# Patient Record
Sex: Female | Born: 1970 | Race: Black or African American | Hispanic: No | Marital: Married | Smoking: Never smoker
Health system: Southern US, Community
[De-identification: ages and names within clinical notes are randomized; demographics above are authoritative.]

---

## 1998-01-17 ENCOUNTER — Inpatient Hospital Stay (HOSPITAL_COMMUNITY): Admission: AD | Admit: 1998-01-17 | Discharge: 1998-01-17 | Payer: Self-pay | Admitting: *Deleted

## 1999-01-09 ENCOUNTER — Inpatient Hospital Stay (HOSPITAL_COMMUNITY): Admission: AD | Admit: 1999-01-09 | Discharge: 1999-01-09 | Payer: Self-pay | Admitting: *Deleted

## 1999-12-30 ENCOUNTER — Encounter: Payer: Self-pay | Admitting: *Deleted

## 1999-12-30 ENCOUNTER — Inpatient Hospital Stay (HOSPITAL_COMMUNITY): Admission: AD | Admit: 1999-12-30 | Discharge: 1999-12-30 | Payer: Self-pay | Admitting: *Deleted

## 2000-01-01 ENCOUNTER — Encounter: Admission: RE | Admit: 2000-01-01 | Discharge: 2000-01-01 | Payer: Self-pay | Admitting: Obstetrics & Gynecology

## 2000-09-01 ENCOUNTER — Other Ambulatory Visit: Admission: RE | Admit: 2000-09-01 | Discharge: 2000-09-01 | Payer: Self-pay | Admitting: *Deleted

## 2001-09-03 ENCOUNTER — Other Ambulatory Visit: Admission: RE | Admit: 2001-09-03 | Discharge: 2001-09-03 | Payer: Self-pay | Admitting: *Deleted

## 2012-01-04 ENCOUNTER — Emergency Department (HOSPITAL_BASED_OUTPATIENT_CLINIC_OR_DEPARTMENT_OTHER)
Admission: EM | Admit: 2012-01-04 | Discharge: 2012-01-04 | Disposition: A | Discharge status: Home / Self Care | Payer: 59 | Attending: Emergency Medicine | Admitting: Emergency Medicine

## 2012-01-04 ENCOUNTER — Encounter (HOSPITAL_BASED_OUTPATIENT_CLINIC_OR_DEPARTMENT_OTHER): Payer: Self-pay | Admitting: *Deleted

## 2012-01-04 DIAGNOSIS — R109 Unspecified abdominal pain: Secondary | ICD-10-CM | POA: Insufficient documentation

## 2012-01-04 DIAGNOSIS — M549 Dorsalgia, unspecified: Secondary | ICD-10-CM | POA: Insufficient documentation

## 2012-01-04 DIAGNOSIS — N72 Inflammatory disease of cervix uteri: Secondary | ICD-10-CM | POA: Insufficient documentation

## 2012-01-04 DIAGNOSIS — R110 Nausea: Secondary | ICD-10-CM | POA: Insufficient documentation

## 2012-01-04 LAB — URINALYSIS, ROUTINE W REFLEX MICROSCOPIC
Bilirubin Urine: NEGATIVE
Glucose, UA: NEGATIVE mg/dL
Hgb urine dipstick: NEGATIVE
Ketones, ur: 15 mg/dL — AB
Leukocytes, UA: NEGATIVE
Nitrite: NEGATIVE
Protein, ur: NEGATIVE mg/dL
Specific Gravity, Urine: 1.023 (ref 1.005–1.030)
Urobilinogen, UA: 0.2 mg/dL (ref 0.0–1.0)
pH: 6 (ref 5.0–8.0)

## 2012-01-04 LAB — WET PREP, GENITAL
Clue Cells Wet Prep HPF POC: NONE SEEN
Trich, Wet Prep: NONE SEEN
Yeast Wet Prep HPF POC: NONE SEEN

## 2012-01-04 LAB — PREGNANCY, URINE: Preg Test, Ur: NEGATIVE

## 2012-01-04 MED ORDER — CEFTRIAXONE SODIUM 250 MG IJ SOLR
250.0000 mg | Freq: Once | INTRAMUSCULAR | Status: AC
  Administered 2012-01-04: 250 mg via INTRAMUSCULAR
  Filled 2012-01-04: qty 250

## 2012-01-04 MED ORDER — LIDOCAINE HCL (PF) 1 % IJ SOLN
INTRAMUSCULAR | Status: AC
  Administered 2012-01-04: 5 mL
  Filled 2012-01-04: qty 5

## 2012-01-04 MED ORDER — AZITHROMYCIN 1 G PO PACK: 1.0000 g | PACK | Freq: Once | ORAL | Status: DC

## 2012-01-04 MED ORDER — ONDANSETRON 4 MG PO TBDP
4.0000 mg | ORAL_TABLET | Freq: Once | ORAL | Status: AC
Start: 2012-01-04 — End: 2012-01-04
  Administered 2012-01-04: 4 mg via ORAL
  Filled 2012-01-04: qty 1

## 2012-01-04 MED ORDER — AZITHROMYCIN 250 MG PO TABS
ORAL_TABLET | ORAL | Status: AC
  Administered 2012-01-04: 1000 mg
  Filled 2012-01-04: qty 4

## 2012-01-04 NOTE — ED Notes (Signed)
D/c home with no rx given

## 2012-01-04 NOTE — Discharge Instructions (Signed)
Cervicitis   Cervicitis is a soreness and swelling (inflammation) of the cervix. Your cervix is located at the bottom of your uterus which opens up to the vagina.   CAUSES   Sexually transmitted infections (STIs).   Allergic reaction.   Medicines or birth control devices that are put in the vagina.   Injury to the cervix.   Bacterial infections.   SYMPTOMS   There may be no symptoms. If symptoms occur, they may include:   Grey, white, yellow, or bad smelling vaginal discharge.   Pain or itching of the area outside the vagina.   Painful sexual intercourse.   Lower abdominal or lower back pain, especially during intercourse.   Frequent urination.   Abnormal vaginal bleeding between periods, after sexual intercourse, or after menopause.   Pressure or a heavy feeling in the pelvis.   DIAGNOSIS   Diagnosis is made after a pelvic exam. Other tests may include:   Examination of any discharge under a microscope (wet prep).   A Pap test.   TREATMENT   Treatment will depend on the cause of cervicitis. If it is caused by an STI, both you and your partner will need to be treated. Antibiotic medicines will be given.   HOME CARE INSTRUCTIONS   Do not have sexual intercourse until your caregiver says it is okay.   Do not have sexual intercourse until your partner has been treated if your cervicitis is caused by an STI.   Take your antibiotics as directed. Finish them even if you start to feel better.   SEEK IMMEDIATE MEDICAL CARE IF:   Your symptoms come back.   You have a fever.   You experience any problems that may be related to the medicine you are taking.   MAKE SURE YOU:   Understand these instructions.   Will watch your condition.   Will get help right away if you are not doing well or get worse.   Document Released: 09/09/2005 Document Revised: 08/29/2011 Document Reviewed: 04/08/2011   [employer] Patient Information 2012 [employer].

## 2012-01-04 NOTE — ED Notes (Signed)
Pt c/o low abd and back pain since Friday. Tan vaginal d/c and itching. +nausea

## 2012-01-04 NOTE — ED Provider Notes (Signed)
History     CSN: [id number]  Arrival date & time 01/04/12  2001   First MD Initiated Contact with Patient 01/04/12 2046      Chief Complaint  Patient presents with  . Abdominal Pain    (Consider location/radiation/quality/duration/timing/severity/associated sxs/prior treatment) Patient is a 41 y.o. female presenting with abdominal pain. The history is provided by the patient. No language interpreter was used.  Abdominal Pain The primary symptoms of the illness include abdominal pain, nausea and vaginal discharge. The primary symptoms of the illness do not include vomiting, diarrhea or dysuria. The current episode started yesterday. The onset of the illness was gradual. The problem has not changed since onset. The vaginal discharge is not associated with dysuria.   The patient states that she believes she is currently not pregnant. Additional symptoms associated with the illness include back pain. Symptoms associated with the illness do not include urgency or frequency.    History reviewed. No pertinent past medical history.  History reviewed. No pertinent past surgical history.  History reviewed. No pertinent family history.  History  Substance Use Topics  . Smoking status: Never Smoker   . Smokeless tobacco: Not on file  . Alcohol Use: No    OB History    Grav Para Term Preterm Abortions TAB SAB Ect Mult Living                  Review of Systems  Gastrointestinal: Positive for nausea and abdominal pain. Negative for vomiting and diarrhea.  Genitourinary: Positive for vaginal discharge. Negative for dysuria, urgency and frequency.  Musculoskeletal: Positive for back pain.  All other systems reviewed and are negative.    Allergies  Review of patient's allergies indicates not on file.  Home Medications  No current outpatient prescriptions on file.  BP 107/72  Pulse 70  Temp(Src) 97.9 F (36.6 C) (Oral)  Resp 20  Ht 5' 4.75" (1.645 m)  Wt 145 lb (65.772 kg)   BMI 24.32 kg/m2  SpO2 100%  LMP 12/22/2011  Physical Exam  Nursing note and vitals reviewed. Constitutional: She is oriented to person, place, and time. She appears well-developed and well-nourished.  HENT:  Head: Normocephalic and atraumatic.  Cardiovascular: Normal rate and regular rhythm.   Pulmonary/Chest: Effort normal and breath sounds normal.  Abdominal: Soft. Bowel sounds are normal.  Genitourinary: Vaginal discharge found.       -NWG:NFAO yellow vaginal discharge  Musculoskeletal: Normal range of motion.  Neurological: She is alert and oriented to person, place, and time.  Skin: Skin is warm and dry.    ED Course  Procedures (including critical care time)  Labs Reviewed  URINALYSIS, ROUTINE W REFLEX MICROSCOPIC - Abnormal; Notable for the following:    Ketones, ur 15 (*)    All other components within normal limits  WET PREP, GENITAL - Abnormal; Notable for the following:    WBC, Wet Prep HPF POC MANY (*) MODERATE BACTERIA SEEN   All other components within normal limits  PREGNANCY, URINE  GC/CHLAMYDIA PROBE AMP, GENITAL   No results found.   1. Cervicitis       MDM  Pt treat for cervicitis:pt explained follow up as needed        Teressa Lower, NP 01/04/12 2327

## 2012-01-05 NOTE — ED Provider Notes (Signed)
Medical screening examination/treatment/procedure(s) were performed by non-physician practitioner and as supervising physician I was immediately available for consultation/collaboration.    Nelia Shi, MD 01/05/12 [phone]

## 2012-01-06 LAB — GC/CHLAMYDIA PROBE AMP, GENITAL
Chlamydia, DNA Probe: NEGATIVE
GC Probe Amp, Genital: NEGATIVE

## 2016-10-25 DIAGNOSIS — M7581 Other shoulder lesions, right shoulder: Secondary | ICD-10-CM | POA: Insufficient documentation

## 2017-04-21 DIAGNOSIS — M75121 Complete rotator cuff tear or rupture of right shoulder, not specified as traumatic: Secondary | ICD-10-CM | POA: Insufficient documentation

## 2017-07-23 DIAGNOSIS — Z9889 Other specified postprocedural states: Secondary | ICD-10-CM | POA: Insufficient documentation

## 2018-06-10 DIAGNOSIS — E538 Deficiency of other specified B group vitamins: Secondary | ICD-10-CM | POA: Insufficient documentation

## 2018-06-10 DIAGNOSIS — E559 Vitamin D deficiency, unspecified: Secondary | ICD-10-CM | POA: Insufficient documentation

## 2019-04-14 DIAGNOSIS — K219 Gastro-esophageal reflux disease without esophagitis: Secondary | ICD-10-CM | POA: Insufficient documentation

## 2019-09-24 HISTORY — PX: CHOLECYSTECTOMY: SHX55

## 2019-11-22 ENCOUNTER — Encounter (HOSPITAL_COMMUNITY): Payer: Self-pay

## 2019-11-22 ENCOUNTER — Other Ambulatory Visit: Payer: Self-pay

## 2019-11-22 ENCOUNTER — Emergency Department (HOSPITAL_COMMUNITY): Payer: 59

## 2019-11-22 ENCOUNTER — Emergency Department (HOSPITAL_COMMUNITY)
Admission: EM | Admit: 2019-11-22 | Discharge: 2019-11-22 | Disposition: A | Discharge status: Home / Self Care | Payer: 59 | Attending: Emergency Medicine | Admitting: Emergency Medicine

## 2019-11-22 DIAGNOSIS — K8020 Calculus of gallbladder without cholecystitis without obstruction: Secondary | ICD-10-CM | POA: Diagnosis not present

## 2019-11-22 DIAGNOSIS — R1013 Epigastric pain: Secondary | ICD-10-CM | POA: Diagnosis present

## 2019-11-22 DIAGNOSIS — K529 Noninfective gastroenteritis and colitis, unspecified: Secondary | ICD-10-CM

## 2019-11-22 DIAGNOSIS — K5289 Other specified noninfective gastroenteritis and colitis: Secondary | ICD-10-CM | POA: Diagnosis not present

## 2019-11-22 LAB — CBC
HCT: 38.1 % (ref 36.0–46.0)
Hemoglobin: 12.9 g/dL (ref 12.0–15.0)
MCH: 30.4 pg (ref 26.0–34.0)
MCHC: 33.9 g/dL (ref 30.0–36.0)
MCV: 89.9 fL (ref 80.0–100.0)
Platelets: 318 10*3/uL (ref 150–400)
RBC: 4.24 MIL/uL (ref 3.87–5.11)
RDW: 12.7 % (ref 11.5–15.5)
WBC: 13.3 10*3/uL — ABNORMAL HIGH (ref 4.0–10.5)
nRBC: 0 % (ref 0.0–0.2)

## 2019-11-22 LAB — COMPREHENSIVE METABOLIC PANEL
ALT: 15 U/L (ref 0–44)
AST: 19 U/L (ref 15–41)
Albumin: 4 g/dL (ref 3.5–5.0)
Alkaline Phosphatase: 30 U/L — ABNORMAL LOW (ref 38–126)
Anion gap: 8 (ref 5–15)
BUN: 13 mg/dL (ref 6–20)
CO2: 24 mmol/L (ref 22–32)
Calcium: 9 mg/dL (ref 8.9–10.3)
Chloride: 104 mmol/L (ref 98–111)
Creatinine, Ser: 0.99 mg/dL (ref 0.44–1.00)
GFR calc Af Amer: >60 mL/min (ref >60)
GFR calc non Af Amer: >60 mL/min (ref >60)
Glucose, Bld: 112 mg/dL — ABNORMAL HIGH (ref 70–99)
Potassium: 4.4 mmol/L (ref 3.5–5.1)
Sodium: 136 mmol/L (ref 135–145)
Total Bilirubin: 0.2 mg/dL — ABNORMAL LOW (ref 0.3–1.2)
Total Protein: 7.4 g/dL (ref 6.5–8.1)

## 2019-11-22 LAB — URINALYSIS, ROUTINE W REFLEX MICROSCOPIC
Bilirubin Urine: NEGATIVE
Glucose, UA: NEGATIVE mg/dL
Hgb urine dipstick: NEGATIVE
Ketones, ur: 20 mg/dL — AB
Leukocytes,Ua: NEGATIVE
Nitrite: NEGATIVE
Protein, ur: 100 mg/dL — AB
Specific Gravity, Urine: 1.03 (ref 1.005–1.030)
pH: 6 (ref 5.0–8.0)

## 2019-11-22 LAB — LIPASE, BLOOD: Lipase: 16 U/L (ref 11–51)

## 2019-11-22 LAB — I-STAT BETA HCG BLOOD, ED (MC, WL, AP ONLY): I-stat hCG, quantitative: <5 m[IU]/mL (ref <5)

## 2019-11-22 MED ORDER — IOHEXOL 300 MG/ML  SOLN
100.0000 mL | Freq: Once | INTRAMUSCULAR | Status: AC | PRN
  Administered 2019-11-22: 100 mL via INTRAVENOUS

## 2019-11-22 MED ORDER — SODIUM CHLORIDE (PF) 0.9 % IJ SOLN
INTRAMUSCULAR | Status: AC
  Filled 2019-11-22: qty 50

## 2019-11-22 MED ORDER — ONDANSETRON HCL 4 MG/2ML IJ SOLN
4.0000 mg | Freq: Once | INTRAMUSCULAR | Status: AC
  Administered 2019-11-22: 4 mg via INTRAVENOUS
  Filled 2019-11-22: qty 2

## 2019-11-22 MED ORDER — SODIUM CHLORIDE 0.9% FLUSH
3.0000 mL | Freq: Once | INTRAVENOUS | Status: AC
  Administered 2019-11-22: 3 mL via INTRAVENOUS

## 2019-11-22 MED ORDER — FENTANYL CITRATE (PF) 100 MCG/2ML IJ SOLN
100.0000 ug | INTRAMUSCULAR | Status: DC | PRN
  Administered 2019-11-22 (×2): 100 ug via INTRAVENOUS
  Filled 2019-11-22 (×2): qty 2

## 2019-11-22 MED ORDER — METRONIDAZOLE 500 MG PO TABS: 500.0000 mg | ORAL_TABLET | Freq: Two times a day (BID) | ORAL | 0 refills | Status: DC

## 2019-11-22 MED ORDER — HYDROCODONE-ACETAMINOPHEN 5-325 MG PO TABS: 1.0000 | ORAL_TABLET | ORAL | 0 refills | Status: DC | PRN

## 2019-11-22 MED ORDER — SODIUM CHLORIDE 0.9 % IV BOLUS
1000.0000 mL | Freq: Once | INTRAVENOUS | Status: AC
  Administered 2019-11-22: 1000 mL via INTRAVENOUS

## 2019-11-22 MED ORDER — CIPROFLOXACIN HCL 500 MG PO TABS: 500.0000 mg | ORAL_TABLET | Freq: Two times a day (BID) | ORAL | 0 refills | Status: DC

## 2019-11-22 NOTE — ED Notes (Signed)
Pt provided cup for UA, aware sample needee

## 2019-11-22 NOTE — ED Provider Notes (Signed)
[facility]-EMERGENCY DEPT Provider Note   CSN: [id number] Arrival date & time: 11/22/19  1349     History Chief Complaint  Patient presents with  . Abdominal Pain    Aimee Sanchez is a 49 y.o. female.  HPI She presents for evaluation of vomiting and diarrhea which started today, multiple times each.  She denies blood in emesis or stool.  She denies fever, chills, cough, shortness of breath, weakness or dizziness.  No known sick exposures.  She works as a Building surveyor.  There are no other known modifying factors.    History reviewed. No pertinent past medical history.  There are no problems to display for this patient.   No past surgical history on file.   OB History   No obstetric history on file.     No family history on file.  Social History   Tobacco Use  . Smoking status: Never Smoker  Substance Use Topics  . Alcohol use: No  . Drug use: No    Home Medications Prior to Admission medications   Medication Sig Start Date End Date Taking? Authorizing Provider  [facility] VITAMIN B12 1000 MCG tablet Take 1,000 mcg by mouth daily. 08/26/19  Yes [provider]  famotidine (PEPCID) 20 MG tablet Take 20 mg by mouth daily.  10/12/19  Yes [provider]  Multiple Vitamin (MULTIVITAMIN) tablet Take 1 tablet by mouth daily.   Yes [provider]  Vitamin D, Ergocalciferol, (DRISDOL) 1.25 MG (50000 UNIT) CAPS capsule Take 50,000 Units by mouth once a week. 07/14/19  Yes [provider]  ciprofloxacin (CIPRO) 500 MG tablet Take 1 tablet (500 mg total) by mouth 2 (two) times daily. One po bid x 7 days 11/22/19   Mancel Bale, MD  HYDROcodone-acetaminophen [facility]) 5-325 MG tablet Take 1 tablet by mouth every 4 (four) hours as needed for moderate pain. 11/22/19   Mancel Bale, MD  metroNIDAZOLE (FLAGYL) 500 MG tablet Take 1 tablet (500 mg total) by mouth 2 (two) times daily. One po bid x 7 days 11/22/19   Mancel Bale, MD     Allergies    Patient has no known allergies.  Review of Systems   Review of Systems  All other systems reviewed and are negative.   Physical Exam Updated Vital Signs BP 123/79   Pulse 65   Temp 98.2 F (36.8 C) (Oral)   Resp 17   Ht 5\' 4"  (1.626 m)   Wt 78 kg   LMP 10/28/2019 (Approximate)   SpO2 100%   BMI 29.52 kg/m   Physical Exam Vitals and nursing note reviewed.  Constitutional:      General: She is not in acute distress.    Appearance: She is well-developed. She is not ill-appearing, toxic-appearing or diaphoretic.  HENT:     Head: Normocephalic and atraumatic.     Right Ear: External ear normal.     Left Ear: External ear normal.  Eyes:     Conjunctiva/sclera: Conjunctivae normal.     Pupils: Pupils are equal, round, and reactive to light.  Neck:     Trachea: Phonation normal.  Cardiovascular:     Rate and Rhythm: Normal rate and regular rhythm.     Heart sounds: Normal heart sounds.  Pulmonary:     Effort: Pulmonary effort is normal.     Breath sounds: Normal breath sounds.  Abdominal:     General: There is no distension.     Palpations: Abdomen is soft. There  is no mass.     Tenderness: There is abdominal tenderness (Epigastric, mild).     Hernia: No hernia is present.  Musculoskeletal:        General: Normal range of motion.     Cervical back: Normal range of motion and neck supple.  Skin:    General: Skin is warm and dry.  Neurological:     Mental Status: She is alert and oriented to person, place, and time.     Cranial Nerves: No cranial nerve deficit.     Sensory: No sensory deficit.     Motor: No abnormal muscle tone.     Coordination: Coordination normal.  Psychiatric:        Mood and Affect: Mood normal.        Behavior: Behavior normal.        Thought Content: Thought content normal.        Judgment: Judgment normal.     ED Results / Procedures / Treatments   Labs (all labs ordered are listed, but only abnormal results are  displayed) Labs Reviewed  COMPREHENSIVE METABOLIC PANEL - Abnormal; Notable for the following components:      Result Value   Glucose, Bld 112 (*)    Alkaline Phosphatase 30 (*)    Total Bilirubin 0.2 (*)    All other components within normal limits  CBC - Abnormal; Notable for the following components:   WBC 13.3 (*)    All other components within normal limits  URINALYSIS, ROUTINE W REFLEX MICROSCOPIC - Abnormal; Notable for the following components:   APPearance TURBID (*)    Ketones, ur 20 (*)    Protein, ur 100 (*)    Bacteria, UA FEW (*)    All other components within normal limits  LIPASE, BLOOD  I-STAT BETA HCG BLOOD, ED ([facility], [facility], [facility] ONLY)    EKG None  Radiology US Abdomen Complete  Result Date: 11/22/2019 CLINICAL DATA:  Upper abdominal pain for 2 days EXAM: ABDOMEN ULTRASOUND COMPLETE COMPARISON:  None. FINDINGS: Gallbladder: Gallbladder is well distended with multiple gallstones within. No wall thickening or pericholecystic fluid is noted. Common bile duct: Diameter: 6 mm Liver: No focal lesion identified. Within normal limits in parenchymal echogenicity. Mild central biliary ductal dilatation is noted. Portal vein is patent on color Doppler imaging with normal direction of blood flow towards the liver. IVC: No abnormality visualized. Pancreas: Visualized portion unremarkable. Spleen: Size and appearance within normal limits. Right Kidney: Length: 8.5 cm. Echogenicity within normal limits. No mass or hydronephrosis visualized. Left Kidney: Length: 9.4 cm. Echogenicity within normal limits. No mass or hydronephrosis visualized. Abdominal aorta: No aneurysm visualized. Other findings: None. IMPRESSION: Multiple gallstones. Mild intrahepatic ductal dilatation is noted centrally. Correlation with laboratory values is recommended. No other focal abnormality is noted. Electronically Signed   By: Alcide Clever M.D.   On: 11/22/2019 20:49   CT Abdomen Pelvis W Contrast  Result Date:  11/22/2019 CLINICAL DATA:  Abdomen pain with vomiting EXAM: CT ABDOMEN AND PELVIS WITH CONTRAST TECHNIQUE: Multidetector CT imaging of the abdomen and pelvis was performed using the standard protocol following bolus administration of intravenous contrast. CONTRAST:  OMNIPAQUE IOHEXOL 300 MG/ML  SOLN COMPARISON:  Chest x-ray 11/22/2027 FINDINGS: Lower chest: Lung bases demonstrate no acute consolidation or effusion. The heart size is within normal limits. Hepatobiliary: No focal hepatic abnormality. No biliary dilatation. Tortuous gallbladder with suspected pericholecystic fluid but otherwise no significant inflammatory change. Pancreas: Unremarkable. No pancreatic ductal dilatation or surrounding  inflammatory changes. Spleen: Normal in size without focal abnormality. Adrenals/Urinary Tract: Adrenal glands are unremarkable. Kidneys are normal, without renal calculi, focal lesion, or hydronephrosis. Bladder is unremarkable. Stomach/Bowel: Stomach is nonenlarged. No dilated small bowel. Negative appendix. Collapsed appearance versus mild colitis type changes of the transverse colon to the rectosigmoid colon. Scattered colon diverticula. Vascular/Lymphatic: No significant vascular findings are present. No enlarged abdominal or pelvic lymph nodes. Reproductive: Lobulated uterus with multiple hypoenhancing masses. No adnexal mass. Other: No free air or free fluid Musculoskeletal: No acute or significant osseous findings. IMPRESSION: 1. Tortuous appearing gallbladder. There appears to be pericholecystic fluid. If acute gallbladder disease is suspected, correlation with ultrasound would be advised. 2. Collapsed appearance versus mild colitis type changes from the transverse colon to the rectosigmoid colon 3. Lobulated uterus with multiple hypoenhancing masses, presumably fibroids. Electronically Signed   By: Donavan Foil M.D.   On: 11/22/2019 18:16   DG Chest Port 1 View  Result Date: 11/22/2019 CLINICAL DATA:   Chest and abdominal pain with vomiting and diarrhea. EXAM: PORTABLE CHEST 1 VIEW COMPARISON:  None. FINDINGS: The cardiac silhouette, mediastinal and hilar contours are normal. The lungs are clear. No pleural effusions. No pulmonary lesions. The bony thorax is intact. IMPRESSION: Normal chest x-ray. Electronically Signed   By: Marijo Sanes M.D.   On: 11/22/2019 16:58    Procedures Procedures (including critical care time)  Medications Ordered in ED Medications  fentaNYL (SUBLIMAZE) injection 100 mcg (100 mcg Intravenous Given 11/22/19 1936)  sodium chloride flush (NS) 0.9 % injection 3 mL (3 mLs Intravenous Given 11/22/19 1718)  ondansetron (ZOFRAN) injection 4 mg (4 mg Intravenous Given 11/22/19 1719)  sodium chloride 0.9 % bolus 1,000 mL (0 mLs Intravenous Stopped 11/22/19 1934)  iohexol (OMNIPAQUE) 300 MG/ML solution 100 mL (100 mLs Intravenous Contrast Given 11/22/19 1748)  ondansetron (ZOFRAN) injection 4 mg (4 mg Intravenous Given 11/22/19 1943)    ED Course  I have reviewed the triage vital signs and the nursing notes.  Pertinent labs & imaging results that were available during my care of the patient were reviewed by me and considered in my medical decision making (see chart for details).  Clinical Course as of Nov 22 2134  Mon Nov 22, 2019  2056 Normal except turbid appearance, presence of ketones and protein, few bacteria and 6-10 RBCs  Urinalysis, Routine w reflex microscopic(!) [EW]  2057 Normal  I-Stat beta hCG blood, ED [EW]  2057 Normal  Lipase, blood [EW]  2057 Normal except white count elevated  CBC(!) [EW]  2057 Normal except glucose high, alkaline phosphatase high, total bilirubin low  Comprehensive metabolic panel(!) [EW]  4854 No infiltrate or CHF, interpreted by me  DG Chest [facility] [EW]  2058 Per radiologist, possible cholecystitis, and possible colitis.   [EW]  2058 Per radiologist, gallstones without cholecystitis.   [EW]    Clinical Course User Index [EW]  Daleen Bo, MD   MDM Rules/Calculators/A&P                       Patient Vitals for the past 24 hrs:  BP Temp Temp src Pulse Resp SpO2 Height Weight  11/22/19 2100 123/79 -- -- 65 17 100 % -- --  11/22/19 2000 117/79 -- -- 77 19 98 % -- --  11/22/19 1912 140/89 -- -- 64 15 100 % -- --  11/22/19 1643 121/72 -- -- 65 16 100 % -- --  11/22/19 1413 -- -- -- -- --  99 % 5\' 4"  (1.626 m) 78 kg  11/22/19 1410 131/86 98.2 F (36.8 C) Oral 78 16 99 % -- --    9:17 PM Reevaluation with update and discussion. After initial assessment and treatment, an updated evaluation reveals she is fairly comfortable at this time.  Right upper quadrant, with mild tenderness but no rebound tenderness.  Remainder the abdomen is benign.  Findings discussed with patient all questions were answered. 01/22/20   Medical Decision Making: Abdominal pain, with gallstones and mild colitis.  Doubt serious bacterial infection, metabolic instability or impending vascular collapse.  Patient symptoms controlled in the ED and she is stable for discharge with outpatient management.  Kiylah Jones-Bennett was evaluated in Emergency Department on 11/22/2019 for the symptoms described in the history of present illness. She was evaluated in the context of the global COVID-19 pandemic, which necessitated consideration that the patient might be at risk for infection with the SARS-CoV-2 virus that causes COVID-19. Institutional protocols and algorithms that pertain to the evaluation of patients at risk for COVID-19 are in a state of rapid change based on information released by regulatory bodies including the CDC and federal and state organizations. These policies and algorithms were followed during the patient's care in the ED.   CRITICAL CARE-no Performed by: 01/22/2020   Nursing Notes Reviewed/ Care Coordinated Applicable Imaging Reviewed Interpretation of Laboratory Data incorporated into ED treatment  The patient appears  reasonably screened and/or stabilized for discharge and I doubt any other medical condition or other [facility] requiring further screening, evaluation, or treatment in the ED at this time prior to discharge.  Plan: Home Medications-continue usual; Home Treatments-low-fat diet; return here if the recommended treatment, does not improve the symptoms; Recommended follow up-general surgery follow-up for gallstones as soon as possible    Final Clinical Impression(s) / ED Diagnoses Final diagnoses:  Gallstones  Colitis    Rx / DC Orders ED Discharge Orders         Ordered    HYDROcodone-acetaminophen (NORCO) 5-325 MG tablet  Every 4 hours PRN     11/22/19 2131    ciprofloxacin (CIPRO) 500 MG tablet  2 times daily     11/22/19 2132    metroNIDAZOLE (FLAGYL) 500 MG tablet  2 times daily     11/22/19 2132           2133, MD 11/22/19 2136

## 2019-11-22 NOTE — ED Triage Notes (Signed)
49 yo female from home with midumbilical abd pain and vomiting since 1030pm last night. Diarrhea since this morning. Vomiting bile at this time. Denies fevers/chills. Hx of GERD.

## 2019-11-22 NOTE — Discharge Instructions (Addendum)
Stay on a low-fat diet, and drink plenty of fluids to prevent problems from the gallstones.  Stay on a low fiber diet, to prevent complications from colitis.  Call the surgeons for follow-up appointment about the gallstones and colitis.

## 2019-12-13 ENCOUNTER — Ambulatory Visit: Payer: Self-pay | Admitting: Surgery

## 2019-12-13 NOTE — H&P (Signed)
Aimee Sanchez Documented: 12/13/2019 3:00 PM Location: [facility] Patient #: [id number] DOB: [birth year] Single / Language: [place] / Race: Black or African American Female  History of Present Illness Aimee Sportsman MD; 12/13/2019 3:38 PM) The patient is a 49 year old female who presents for evaluation of gall stones. Note for "Gall stones": ` ` ` Patient sent for surgical consultation at the request of Dr Gray Bernhardt  Chief Complaint: Nausea vomiting and gallstones. Consider cholecystectomy ` ` The patient is a active woman otherwise healthy who had nausea or vomiting. Persisted. Went to the emergency room. No hematemesis. No hematochezia. Upper abdominal discomfort. CAT scan concerning for some pericholecystic fluid. Left-sided colon with some mild thickening concerning for possible collapse versus colitis. Patient seems more uncomfortable and upper abdomen. Ultrasound done. No definite cholecystitis noted. Patient seemed to improve and stabilize emergency room. Surgical follow-up recommended.  She gets episodes of upper abdominal pain. Some crampiness she feels like she has more chronic soreness in right upper quadrant now. She's had some episodes of loose bowel movements she's been constipated more than a couple days. She denies really any true diarrhea. No hematochezia. She's never had any abdominal surgery. She really drinks alcohol. Her husband thought that maybe was a kidney stone since he had similar symptoms. However CAT scan rule out out. No history of urinary tract infections or pyelonephritis. She can walk a couple miles without difficulty. Indigestion usually treated over-the-counter. This felt different from this and she cannot get improvement with those medications  No personal nor family history of GI/colon cancer, inflammatory bowel disease, irritable bowel syndrome, allergy such as Celiac Sprue, dietary/dairy problems, colitis, ulcers  nor gastritis. No recent sick contacts/gastroenteritis. No travel outside the country. No changes in diet. No dysphagia to solids or liquids. No significant heartburn or reflux. No hematochezia, hematemesis, coffee ground emesis. No evidence of prior gastric/peptic ulceration.  (Review of systems as stated in this history (HPI) or in the review of systems. Otherwise all other 12 point ROS are negative) ` ` `  This patient encounter took 35 minutes today to perform the following: obtain history, perform exam, review outside records, interpret tests & imaging, counsel the patient on their diagnosis; and, document this encounter, including findings & plan in the electronic health record (EHR).   Past Surgical History Aimee Sanchez, CMA; 12/13/2019 3:12 PM) Foot Surgery Bilateral.  Diagnostic Studies History (Aimee Sanchez, CMA; 12/13/2019 3:12 PM) Colonoscopy never Mammogram within last year Pap Smear 1-5 years ago  Allergies Aimee Sanchez, CMA; 12/13/2019 3:14 PM) No Known Allergies [12/13/2019]:  Medication History Aimee Sanchez, CMA; 12/13/2019 3:14 PM) Multivitamin Adult (Oral) Active. Medications Reconciled  Social History Aimee Sanchez, CMA; 12/13/2019 3:12 PM) Alcohol use Occasional alcohol use. Caffeine use Coffee. Tobacco use Never smoker.  Family History Aimee Sanchez, CMA; 12/13/2019 3:12 PM) Diabetes Mellitus Mother. Heart Disease Father, Mother. Hypertension Father, Mother.  Pregnancy / Birth History Aimee Sanchez, CMA; 12/13/2019 3:12 PM) Age at menarche 13 years. Age of menopause 33-50 Gravida 1 Length (months) of breastfeeding 3-6 Maternal age 61-35 Para 1 Regular periods  Other Problems Aimee Sanchez, CMA; 12/13/2019 3:12 PM) Gastroesophageal Reflux Disease     Review of Systems (Aimee Sanchez CMA; 12/13/2019 3:12 PM) General Present- Appetite Loss. Not Present- Chills, Fatigue, Fever, Night Sweats, Weight Gain and  Weight Loss. Skin Not Present- Change in Wart/Mole, Dryness, Hives, Jaundice, New Lesions, Non-Healing Wounds, Rash and Ulcer. HEENT Not Present- Earache, Hearing Loss, Hoarseness, Nose Bleed, Oral  Ulcers, Ringing in the Ears, Seasonal Allergies, Sinus Pain, Sore Throat, Visual Disturbances, Wears glasses/contact lenses and Yellow Eyes. Respiratory Not Present- Bloody sputum, Chronic Cough, Difficulty Breathing, Snoring and Wheezing. Breast Not Present- Breast Mass, Breast Pain, Nipple Discharge and Skin Changes. Cardiovascular Not Present- Chest Pain, Difficulty Breathing Lying Down, Leg Cramps, Palpitations, Rapid Heart Rate, Shortness of Breath and Swelling of Extremities. Gastrointestinal Present- Abdominal Pain, Bloating, Excessive gas, Indigestion and Nausea. Not Present- Bloody Stool, Change in Bowel Habits, Chronic diarrhea, Constipation, Difficulty Swallowing, Gets full quickly at meals, Hemorrhoids, Rectal Pain and Vomiting. Female Genitourinary Not Present- Frequency, Nocturia, Painful Urination, Pelvic Pain and Urgency. Neurological Not Present- Decreased Memory, Fainting, Headaches, Numbness, Seizures, Tingling, Tremor, Trouble walking and Weakness. Psychiatric Not Present- Anxiety, Bipolar, Change in Sleep Pattern, Depression, Fearful and Frequent crying. Endocrine Not Present- Cold Intolerance, Excessive Hunger, Hair Changes, Heat Intolerance, Hot flashes and New Diabetes. Hematology Not Present- Blood Thinners, Easy Bruising, Excessive bleeding, Gland problems, HIV and Persistent Infections.  Vitals (Aimee Sanchez CMA; 12/13/2019 3:14 PM) 12/13/2019 3:12 PM Weight: 171.25 lb Height: 65.5in Body Surface Area: 1.86 m Body Mass Index: 28.06 kg/m  Temp.: 97.66F  Pulse: 104 (Regular)  P.OX: 99% (Room air) BP: 102/78 (Sitting, Left Arm, Standard)        Physical Exam Aimee Sportsman MD; 12/13/2019 3:36 PM)  General Mental Status-Alert. General Appearance-Not  in acute distress, Not Sickly. Orientation-Oriented X3. Hydration-Well hydrated. Voice-Normal. Note: Sitting normally. Well-nourished. Not toxic. Not sickly appearing  Integumentary Global Assessment Upon inspection and palpation of skin surfaces of the - Axillae: non-tender, no inflammation or ulceration, no drainage. and Distribution of scalp and body hair is normal. General Characteristics Temperature - normal warmth is noted.  Head and Neck Head-normocephalic, atraumatic with no lesions or palpable masses. Face Global Assessment - atraumatic, no absence of expression. Neck Global Assessment - no abnormal movements, no bruit auscultated on the right, no bruit auscultated on the left, no decreased range of motion, non-tender. Trachea-midline. Thyroid Gland Characteristics - non-tender.  Eye Eyeball - Left-Extraocular movements intact, No Nystagmus - Left. Eyeball - Right-Extraocular movements intact, No Nystagmus - Right. Cornea - Left-No Hazy - Left. Cornea - Right-No Hazy - Right. Sclera/Conjunctiva - Left-No scleral icterus, No Discharge - Left. Sclera/Conjunctiva - Right-No scleral icterus, No Discharge - Right. Pupil - Left-Direct reaction to light normal. Pupil - Right-Direct reaction to light normal.  ENMT Ears Pinna - Left - no drainage observed, no generalized tenderness observed. Pinna - Right - no drainage observed, no generalized tenderness observed. Nose and Sinuses External Inspection of the Nose - no destructive lesion observed. Inspection of the nares - Left - quiet respiration. Inspection of the nares - Right - quiet respiration. Mouth and Throat Lips - Upper Lip - no fissures observed, no pallor noted. Lower Lip - no fissures observed, no pallor noted. Nasopharynx - no discharge present. Oral Cavity/Oropharynx - Tongue - no dryness observed. Oral Mucosa - no cyanosis observed. Hypopharynx - no evidence of airway distress  observed.  Chest and Lung Exam Inspection Movements - Normal and Symmetrical. Accessory muscles - No use of accessory muscles in breathing. Palpation Palpation of the chest reveals - Non-tender. Auscultation Breath sounds - Normal and Clear.  Cardiovascular Auscultation Rhythm - Regular. Murmurs & Other Heart Sounds - Auscultation of the heart reveals - No Murmurs and No Systolic Clicks.  Abdomen Inspection Inspection of the abdomen reveals - No Visible peristalsis and No Abnormal pulsations. Umbilicus - No Bleeding, No Urine  drainage. Palpation/Percussion Palpation and Percussion of the abdomen reveal - Soft, Non Tender, No Rebound tenderness, No Rigidity (guarding) and No Cutaneous hyperesthesia. Note: Abdomen soft. Mild discomfort wrapper quadrant but no true Murphy sign. Minimal epigastric discomfort. Left upper quadrant lower abdomen nontender. Mild diastases right. Not severely distended. No umbilical or other anterior abdominal wall hernias  Female Genitourinary Sexual Maturity Tanner 5 - Adult hair pattern. Note: No vaginal bleeding nor discharge  Peripheral Vascular Upper Extremity Inspection - Left - No Cyanotic nailbeds - Left, Not Ischemic. Inspection - Right - No Cyanotic nailbeds - Right, Not Ischemic.  Neurologic Neurologic evaluation reveals -normal attention span and ability to concentrate, able to name objects and repeat phrases. Appropriate fund of knowledge , normal sensation and normal coordination. Mental Status Affect - not angry, not paranoid. Cranial Nerves-Normal Bilaterally. Gait-Normal.  Neuropsychiatric Mental status exam performed with findings of-able to articulate well with normal speech/language, rate, volume and coherence, thought content normal with ability to perform basic computations and apply abstract reasoning and no evidence of hallucinations, delusions, obsessions or homicidal/suicidal ideation.  Musculoskeletal Global  Assessment Spine, Ribs and Pelvis - no instability, subluxation or laxity. Right Upper Extremity - no instability, subluxation or laxity.  Lymphatic Head & Neck  General Head & Neck Lymphatics: Bilateral - Description - No Localized lymphadenopathy. Axillary  General Axillary Region: Bilateral - Description - No Localized lymphadenopathy. Femoral & Inguinal  Generalized Femoral & Inguinal Lymphatics: Left - Description - No Localized lymphadenopathy. Right - Description - No Localized lymphadenopathy.    Assessment & Plan Aimee Sportsman MD; 12/13/2019 3:36 PM)  CHRONIC CHOLECYSTITIS WITH CALCULUS (K80.10) Impression: Story very suspicious for biliary colic. Recurrent symptoms. Pain more the right upper quadrant. That seems to correlate better with her episodes of epigastric pain and nausea and vomiting.  Did not seem consistent with heartburn or reflux. This question of colitis I think is an overcall as with his collapse without inflammation. She did and he did not have any diarrhea or hematochezia. Otherwise healthy from a cardiac standpoint. No alcohol or other hepatobiliary pancreatic risk factors.  I think she would benefit from cholecystectomy. She does seem to have a stretched out curled gallbladder. Not consistent with a choledochal cyst. Reasonable single site approach. Outpatient surgery. She is interested in proceeding. Her husband agrees  Current Plans You are being scheduled for surgery- Our schedulers will call you.  You should hear from our office's scheduling department within 5 working days about the location, date, and time of surgery. We try to make accommodations for patient's preferences in scheduling surgery, but sometimes the OR schedule or the surgeon's schedule prevents [place] from making those accommodations.  If you have not heard from our office [phone]) in 5 working days, call the office and ask for your surgeon's nurse.  If you have other questions  about your diagnosis, plan, or surgery, call the office and ask for your surgeon's nurse.  Written instructions provided Pt Education - Pamphlet Given - Laparoscopic Gallbladder Surgery: discussed with patient and provided information. The anatomy & physiology of hepatobiliary & pancreatic function was discussed. The pathophysiology of gallbladder dysfunction was discussed. Natural history risks without surgery was discussed. I feel the risks of no intervention will lead to serious problems that outweigh the operative risks; therefore, I recommended cholecystectomy to remove the pathology. I explained laparoscopic techniques with possible need for an open approach. Probable cholangiogram to evaluate the bilary tract was explained as well.  Risks such as bleeding,  infection, abscess, leak, injury to other organs, need for further treatment, heart attack, death, and other risks were discussed. I noted a good likelihood this will help address the problem. Possibility that this will not correct all abdominal symptoms was explained. Goals of post-operative recovery were discussed as well. We will work to minimize complications. An educational handout further explaining the pathology and treatment options was given as well. Questions were answered. The patient expresses understanding & wishes to proceed with surgery.  Pt Education - [facility] Laparosopic Post Op HCI (Keaundra Stehle) Pt Education - [facility] Good Bowel Health (Zackariah Vanderpol) Pt Education - Laparoscopic Cholecystectomy: gallbladder  Adin Hector, MD, FACS, MASCRS Gastrointestinal and Minimally Invasive Surgery  [facility] [address] [place], [place] [address] [phone] Main / Paging [phone] Fax

## 2020-04-01 ENCOUNTER — Inpatient Hospital Stay (HOSPITAL_COMMUNITY)
Admission: EM | Admit: 2020-04-01 | Discharge: 2020-04-02 | DRG: 200 | Disposition: A | Discharge status: Home / Self Care | Payer: 59 | Attending: Surgery | Admitting: Surgery

## 2020-04-01 ENCOUNTER — Inpatient Hospital Stay (HOSPITAL_COMMUNITY): Payer: 59

## 2020-04-01 ENCOUNTER — Encounter (HOSPITAL_COMMUNITY): Payer: Self-pay | Admitting: Radiology

## 2020-04-01 ENCOUNTER — Other Ambulatory Visit: Payer: Self-pay

## 2020-04-01 ENCOUNTER — Emergency Department (HOSPITAL_COMMUNITY): Payer: 59

## 2020-04-01 DIAGNOSIS — M25511 Pain in right shoulder: Secondary | ICD-10-CM | POA: Diagnosis present

## 2020-04-01 DIAGNOSIS — Y92410 Unspecified street and highway as the place of occurrence of the external cause: Secondary | ICD-10-CM | POA: Diagnosis not present

## 2020-04-01 DIAGNOSIS — Z20822 Contact with and (suspected) exposure to covid-19: Secondary | ICD-10-CM | POA: Diagnosis present

## 2020-04-01 DIAGNOSIS — S42001A Fracture of unspecified part of right clavicle, initial encounter for closed fracture: Secondary | ICD-10-CM

## 2020-04-01 DIAGNOSIS — M25512 Pain in left shoulder: Secondary | ICD-10-CM | POA: Diagnosis present

## 2020-04-01 DIAGNOSIS — R519 Headache, unspecified: Secondary | ICD-10-CM | POA: Diagnosis present

## 2020-04-01 DIAGNOSIS — Z79899 Other long term (current) drug therapy: Secondary | ICD-10-CM

## 2020-04-01 DIAGNOSIS — S2242XA Multiple fractures of ribs, left side, initial encounter for closed fracture: Secondary | ICD-10-CM

## 2020-04-01 DIAGNOSIS — S27329A Contusion of lung, unspecified, initial encounter: Secondary | ICD-10-CM

## 2020-04-01 DIAGNOSIS — S42034A Nondisplaced fracture of lateral end of right clavicle, initial encounter for closed fracture: Secondary | ICD-10-CM | POA: Diagnosis not present

## 2020-04-01 DIAGNOSIS — M79662 Pain in left lower leg: Secondary | ICD-10-CM | POA: Diagnosis present

## 2020-04-01 DIAGNOSIS — S270XXA Traumatic pneumothorax, initial encounter: Secondary | ICD-10-CM | POA: Diagnosis present

## 2020-04-01 DIAGNOSIS — S42031A Displaced fracture of lateral end of right clavicle, initial encounter for closed fracture: Secondary | ICD-10-CM | POA: Diagnosis present

## 2020-04-01 DIAGNOSIS — R911 Solitary pulmonary nodule: Secondary | ICD-10-CM | POA: Diagnosis present

## 2020-04-01 DIAGNOSIS — J939 Pneumothorax, unspecified: Secondary | ICD-10-CM

## 2020-04-01 LAB — BASIC METABOLIC PANEL
Anion gap: 8 (ref 5–15)
BUN: 16 mg/dL (ref 6–20)
CO2: 22 mmol/L (ref 22–32)
Calcium: 9 mg/dL (ref 8.9–10.3)
Chloride: 104 mmol/L (ref 98–111)
Creatinine, Ser: 1.09 mg/dL — ABNORMAL HIGH (ref 0.44–1.00)
GFR calc Af Amer: >60 mL/min (ref >60)
GFR calc non Af Amer: 60 mL/min — ABNORMAL LOW (ref >60)
Glucose, Bld: 120 mg/dL — ABNORMAL HIGH (ref 70–99)
Potassium: 4.8 mmol/L (ref 3.5–5.1)
Sodium: 134 mmol/L — ABNORMAL LOW (ref 135–145)

## 2020-04-01 LAB — CBC
HCT: 36.9 % (ref 36.0–46.0)
Hemoglobin: 12.1 g/dL (ref 12.0–15.0)
MCH: 29.7 pg (ref 26.0–34.0)
MCHC: 32.8 g/dL (ref 30.0–36.0)
MCV: 90.4 fL (ref 80.0–100.0)
Platelets: 323 10*3/uL (ref 150–400)
RBC: 4.08 MIL/uL (ref 3.87–5.11)
RDW: 12.7 % (ref 11.5–15.5)
WBC: 19.4 10*3/uL — ABNORMAL HIGH (ref 4.0–10.5)
nRBC: 0 % (ref 0.0–0.2)

## 2020-04-01 LAB — COMPREHENSIVE METABOLIC PANEL
ALT: 18 U/L (ref 0–44)
AST: 30 U/L (ref 15–41)
Albumin: 3.7 g/dL (ref 3.5–5.0)
Alkaline Phosphatase: 38 U/L (ref 38–126)
Anion gap: 11 (ref 5–15)
BUN: 18 mg/dL (ref 6–20)
CO2: 21 mmol/L — ABNORMAL LOW (ref 22–32)
Calcium: 8.9 mg/dL (ref 8.9–10.3)
Chloride: 106 mmol/L (ref 98–111)
Creatinine, Ser: 1.15 mg/dL — ABNORMAL HIGH (ref 0.44–1.00)
GFR calc Af Amer: >60 mL/min (ref >60)
GFR calc non Af Amer: 56 mL/min — ABNORMAL LOW (ref >60)
Glucose, Bld: 102 mg/dL — ABNORMAL HIGH (ref 70–99)
Potassium: 4.5 mmol/L (ref 3.5–5.1)
Sodium: 138 mmol/L (ref 135–145)
Total Bilirubin: 0.4 mg/dL (ref 0.3–1.2)
Total Protein: 6.6 g/dL (ref 6.5–8.1)

## 2020-04-01 LAB — SARS CORONAVIRUS 2 BY RT PCR (HOSPITAL ORDER, PERFORMED IN ~~LOC~~ HOSPITAL LAB): SARS Coronavirus 2: NEGATIVE

## 2020-04-01 LAB — I-STAT CHEM 8, ED
BUN: 27 mg/dL — ABNORMAL HIGH (ref 6–20)
Calcium, Ion: 1.1 mmol/L — ABNORMAL LOW (ref 1.15–1.40)
Chloride: 105 mmol/L (ref 98–111)
Creatinine, Ser: 1.2 mg/dL — ABNORMAL HIGH (ref 0.44–1.00)
Glucose, Bld: 97 mg/dL (ref 70–99)
HCT: 35 % — ABNORMAL LOW (ref 36.0–46.0)
Hemoglobin: 11.9 g/dL — ABNORMAL LOW (ref 12.0–15.0)
Potassium: 6.5 mmol/L (ref 3.5–5.1)
Sodium: 137 mmol/L (ref 135–145)
TCO2: 24 mmol/L (ref 22–32)

## 2020-04-01 LAB — LACTIC ACID, PLASMA: Lactic Acid, Venous: 1.9 mmol/L (ref 0.5–1.9)

## 2020-04-01 LAB — ETHANOL: Alcohol, Ethyl (B): <10 mg/dL (ref <10)

## 2020-04-01 LAB — HIV ANTIBODY (ROUTINE TESTING W REFLEX): HIV Screen 4th Generation wRfx: NONREACTIVE

## 2020-04-01 LAB — I-STAT BETA HCG BLOOD, ED (MC, WL, AP ONLY): I-stat hCG, quantitative: <5 m[IU]/mL (ref <5)

## 2020-04-01 LAB — PROTIME-INR
INR: 1 (ref 0.8–1.2)
Prothrombin Time: 12.3 seconds (ref 11.4–15.2)

## 2020-04-01 LAB — SAMPLE TO BLOOD BANK

## 2020-04-01 MED ORDER — ACETAMINOPHEN 500 MG PO TABS
1000.0000 mg | ORAL_TABLET | Freq: Four times a day (QID) | ORAL | Status: DC
  Administered 2020-04-01 – 2020-04-02 (×4): 1000 mg via ORAL
  Filled 2020-04-01 (×4): qty 2

## 2020-04-01 MED ORDER — LACTATED RINGERS IV SOLN: INTRAVENOUS | Status: DC

## 2020-04-01 MED ORDER — FAMOTIDINE 20 MG PO TABS
20.0000 mg | ORAL_TABLET | Freq: Every day | ORAL | Status: DC
  Administered 2020-04-02: 20 mg via ORAL
  Filled 2020-04-01 (×2): qty 1

## 2020-04-01 MED ORDER — ONDANSETRON HCL 4 MG/2ML IJ SOLN
4.0000 mg | Freq: Four times a day (QID) | INTRAMUSCULAR | Status: DC | PRN
  Administered 2020-04-02: 4 mg via INTRAVENOUS
  Filled 2020-04-01: qty 2

## 2020-04-01 MED ORDER — ONDANSETRON HCL 4 MG/2ML IJ SOLN
4.0000 mg | Freq: Once | INTRAMUSCULAR | Status: AC
  Administered 2020-04-01: 4 mg via INTRAVENOUS
  Filled 2020-04-01: qty 2

## 2020-04-01 MED ORDER — ONDANSETRON 4 MG PO TBDP: 4.0000 mg | ORAL_TABLET | Freq: Four times a day (QID) | ORAL | Status: DC | PRN

## 2020-04-01 MED ORDER — DOCUSATE SODIUM 100 MG PO CAPS
200.0000 mg | ORAL_CAPSULE | Freq: Two times a day (BID) | ORAL | Status: DC
  Administered 2020-04-01 – 2020-04-02 (×2): 200 mg via ORAL
  Filled 2020-04-01 (×2): qty 2

## 2020-04-01 MED ORDER — IBUPROFEN 200 MG PO TABS
600.0000 mg | ORAL_TABLET | Freq: Four times a day (QID) | ORAL | Status: DC | PRN
  Administered 2020-04-01: 600 mg via ORAL
  Filled 2020-04-01: qty 3

## 2020-04-01 MED ORDER — MORPHINE SULFATE (PF) 4 MG/ML IV SOLN
4.0000 mg | Freq: Once | INTRAVENOUS | Status: AC
  Administered 2020-04-01: 4 mg via INTRAVENOUS
  Filled 2020-04-01: qty 1

## 2020-04-01 MED ORDER — ENOXAPARIN SODIUM 40 MG/0.4ML ~~LOC~~ SOLN
40.0000 mg | SUBCUTANEOUS | Status: DC
  Administered 2020-04-01: 40 mg via SUBCUTANEOUS
  Filled 2020-04-01: qty 0.4

## 2020-04-01 MED ORDER — HYDROMORPHONE HCL 1 MG/ML IJ SOLN: 0.5000 mg | INTRAMUSCULAR | Status: DC | PRN

## 2020-04-01 MED ORDER — IOHEXOL 300 MG/ML  SOLN
100.0000 mL | Freq: Once | INTRAMUSCULAR | Status: AC | PRN
  Administered 2020-04-01: 100 mL via INTRAVENOUS

## 2020-04-01 MED ORDER — OXYCODONE HCL 5 MG PO TABS
5.0000 mg | ORAL_TABLET | Freq: Four times a day (QID) | ORAL | Status: DC | PRN
  Administered 2020-04-01 – 2020-04-02 (×4): 10 mg via ORAL
  Filled 2020-04-01 (×4): qty 2

## 2020-04-01 MED ORDER — SODIUM CHLORIDE 0.9 % IV BOLUS
125.0000 mL | Freq: Once | INTRAVENOUS | Status: AC
  Administered 2020-04-01: 125 mL via INTRAVENOUS
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
## 2020-04-01 NOTE — Progress Notes (Signed)
Orthopedic Tech Progress Note Patient Details:  Aimee Sanchez November 18, 1970 [id number]  Ortho Devices Type of Ortho Device: Arm sling Ortho Device/Splint Location: RUE Ortho Device/Splint Interventions: Ordered, Application   Post Interventions Patient Tolerated: Well Instructions Provided: Care of device   Donald Pore 04/01/2020, 4:44 PM

## 2020-04-01 NOTE — ED Notes (Signed)
X-ray at bedside
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
## 2020-04-01 NOTE — Plan of Care (Signed)

## 2020-04-01 NOTE — Plan of Care (Signed)
  Problem: Activity: Goal: Risk for activity intolerance will decrease Outcome: Progressing   Problem: Pain Managment: Goal: General experience of comfort will improve Outcome: Progressing   
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
## 2020-04-01 NOTE — Discharge Instructions (Addendum)
Continue to use your incentive spirometer every hour while awake for the next several days.   Follow up Chest CT recommended in 6 to 12 months  Expect right shoulder pain. Expect right shoulder swelling -ice intermittently as needed You can wear a sling on your right side for comfort purposes. You can come out of the sling as comfort allows. No lifting greater than 5 pounds with the right arm. Limit trying to raise your right arm above your head or behind you until further notice.
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
## 2020-04-02 ENCOUNTER — Inpatient Hospital Stay (HOSPITAL_COMMUNITY): Payer: 59

## 2020-04-02 DIAGNOSIS — S42034A Nondisplaced fracture of lateral end of right clavicle, initial encounter for closed fracture: Secondary | ICD-10-CM

## 2020-04-02 MED ORDER — ACETAMINOPHEN 500 MG PO TABS: 1000.0000 mg | ORAL_TABLET | Freq: Four times a day (QID) | ORAL | 0 refills | Status: DC

## 2020-04-02 MED ORDER — IBUPROFEN 600 MG PO TABS: 600.0000 mg | ORAL_TABLET | Freq: Four times a day (QID) | ORAL | 0 refills | Status: DC | PRN

## 2020-04-02 MED ORDER — OXYCODONE HCL 5 MG PO TABS: 5.0000 mg | ORAL_TABLET | Freq: Four times a day (QID) | ORAL | 0 refills | Status: DC | PRN

## 2020-04-02 NOTE — Plan of Care (Signed)
  Problem: Health Behavior/Discharge Planning: Goal: Ability to manage health-related needs will improve Outcome: Progressing   Problem: Pain Managment: Goal: General experience of comfort will improve Outcome: Progressing   Problem: Safety: Goal: Ability to remain free from injury will improve Outcome: Progressing   
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
## 2020-04-02 NOTE — Progress Notes (Signed)
Pt given discharge instructions and gone over with her. She verbalized understanding. Belongings gathered to be sent home. Pt waiting on ride.

## 2020-04-03 ENCOUNTER — Telehealth: Payer: Self-pay
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
## 2020-04-03 NOTE — Telephone Encounter (Signed)
Patient called in to see about getting a dr note to go back to work .

## 2020-04-03 NOTE — Telephone Encounter (Signed)
This patient was seen in the ER, but you didn't have an opening until the 26th

## 2020-04-04 NOTE — Telephone Encounter (Signed)
Patient aware note written and at front desk

## 2020-04-06 ENCOUNTER — Telehealth: Payer: Self-pay | Admitting: Radiology

## 2020-04-06 NOTE — Telephone Encounter (Signed)
Faxed letter to [phone] Attn: Hazle Coca, Randa Lynn, Orland Jarred

## 2020-04-10 ENCOUNTER — Ambulatory Visit (INDEPENDENT_AMBULATORY_CARE_PROVIDER_SITE_OTHER): Payer: 59

## 2020-04-10 ENCOUNTER — Other Ambulatory Visit: Payer: Self-pay

## 2020-04-10 ENCOUNTER — Encounter: Payer: Self-pay | Admitting: Orthopaedic Surgery

## 2020-04-10 ENCOUNTER — Ambulatory Visit (INDEPENDENT_AMBULATORY_CARE_PROVIDER_SITE_OTHER): Payer: 59 | Admitting: Orthopaedic Surgery

## 2020-04-10 ENCOUNTER — Ambulatory Visit: Payer: Self-pay

## 2020-04-10 VITALS — Ht 64.0 in | Wt 185.0 lb

## 2020-04-10 DIAGNOSIS — M25561 Pain in right knee: Secondary | ICD-10-CM

## 2020-04-10 DIAGNOSIS — M25562 Pain in left knee: Secondary | ICD-10-CM | POA: Diagnosis not present

## 2020-04-10 DIAGNOSIS — S42034A Nondisplaced fracture of lateral end of right clavicle, initial encounter for closed fracture: Secondary | ICD-10-CM

## 2020-04-10 MED ORDER — TIZANIDINE HCL 4 MG PO TABS
4.0000 mg | ORAL_TABLET | Freq: Three times a day (TID) | ORAL | 1 refills | Status: DC | PRN
Start: 2020-04-10 — End: 2023-12-19

## 2020-04-10 MED ORDER — OXYCODONE HCL 5 MG PO TABS: 5.0000 mg | ORAL_TABLET | Freq: Four times a day (QID) | ORAL | 0 refills | Status: DC | PRN

## 2020-04-10 MED ORDER — METHYLPREDNISOLONE 4 MG PO TABS
ORAL_TABLET | ORAL | 0 refills | Status: DC
Start: 2020-04-10 — End: 2023-12-19
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
## 2020-05-03 ENCOUNTER — Telehealth: Payer: Self-pay | Admitting: Orthopedic Surgery

## 2020-05-03 NOTE — Telephone Encounter (Signed)
Disability form received and sent to [employer].

## 2020-05-08 ENCOUNTER — Encounter: Payer: Self-pay | Admitting: Orthopaedic Surgery

## 2020-05-08 ENCOUNTER — Ambulatory Visit (INDEPENDENT_AMBULATORY_CARE_PROVIDER_SITE_OTHER): Payer: 59

## 2020-05-08 ENCOUNTER — Ambulatory Visit (INDEPENDENT_AMBULATORY_CARE_PROVIDER_SITE_OTHER): Payer: 59 | Admitting: Orthopaedic Surgery

## 2020-05-08 DIAGNOSIS — S42034D Nondisplaced fracture of lateral end of right clavicle, subsequent encounter for fracture with routine healing: Secondary | ICD-10-CM | POA: Diagnosis not present

## 2020-05-08 DIAGNOSIS — M25562 Pain in left knee: Secondary | ICD-10-CM

## 2020-05-08 DIAGNOSIS — M25561 Pain in right knee: Secondary | ICD-10-CM

## 2020-05-08 MED ORDER — TRAMADOL HCL 50 MG PO TABS: 50.0000 mg | ORAL_TABLET | Freq: Four times a day (QID) | ORAL | 0 refills | Status: DC | PRN
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
## 2020-05-09 ENCOUNTER — Telehealth: Payer: Self-pay | Admitting: Orthopaedic Surgery

## 2020-05-09 NOTE — Telephone Encounter (Signed)
Please advise, ok for note or do you want to change anything with her requests?

## 2020-05-09 NOTE — Telephone Encounter (Signed)
Patient called needing a no e to return to work with restrictions. Patient asked if she can work 4 to 5 hours a day for the first week? Patient asked for a call before note is faxed.  Patient said she would like the note to be faxed to [facility]   Fax#  [phone] Attention:   Hazle Coca, Cloyde Reams and [place]   Patient would like to pick up a copy of the note as well.   The number to contact patient [phone]

## 2020-05-09 NOTE — Telephone Encounter (Signed)
That will be fine stating that she has been out of work for an orthopedic issue and that she can return to work with only working 4 to 5 hours a day for at least the first 1 to 2 weeks before resuming full work activities.

## 2020-05-09 NOTE — Telephone Encounter (Signed)
Faxed to provided number and left original for patient at the front desk Patient aware

## 2020-05-30 ENCOUNTER — Ambulatory Visit (INDEPENDENT_AMBULATORY_CARE_PROVIDER_SITE_OTHER): Payer: 59 | Admitting: Orthopaedic Surgery

## 2020-05-30 ENCOUNTER — Encounter: Payer: Self-pay | Admitting: Orthopaedic Surgery

## 2020-05-30 ENCOUNTER — Ambulatory Visit (INDEPENDENT_AMBULATORY_CARE_PROVIDER_SITE_OTHER): Payer: 59

## 2020-05-30 DIAGNOSIS — S42034D Nondisplaced fracture of lateral end of right clavicle, subsequent encounter for fracture with routine healing: Secondary | ICD-10-CM
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
## 2020-07-11 ENCOUNTER — Encounter: Payer: Self-pay | Admitting: Orthopaedic Surgery

## 2020-07-11 ENCOUNTER — Ambulatory Visit (INDEPENDENT_AMBULATORY_CARE_PROVIDER_SITE_OTHER): Payer: 59 | Admitting: Orthopaedic Surgery

## 2020-07-11 ENCOUNTER — Ambulatory Visit (INDEPENDENT_AMBULATORY_CARE_PROVIDER_SITE_OTHER): Payer: 59

## 2020-07-11 DIAGNOSIS — S42034D Nondisplaced fracture of lateral end of right clavicle, subsequent encounter for fracture with routine healing: Secondary | ICD-10-CM
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
## 2020-10-30 LAB — COLOGUARD: COLOGUARD: NEGATIVE
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
## 2022-01-12 IMAGING — DX DG CHEST 1V PORT
1 series · 1 of 1 positions shown · non-contrast
Comparison: Chest radiograph April 01, 2020

CLINICAL DATA: Recent pneumothorax following trauma

EXAM:
PORTABLE CHEST 1 VIEW

[chest]
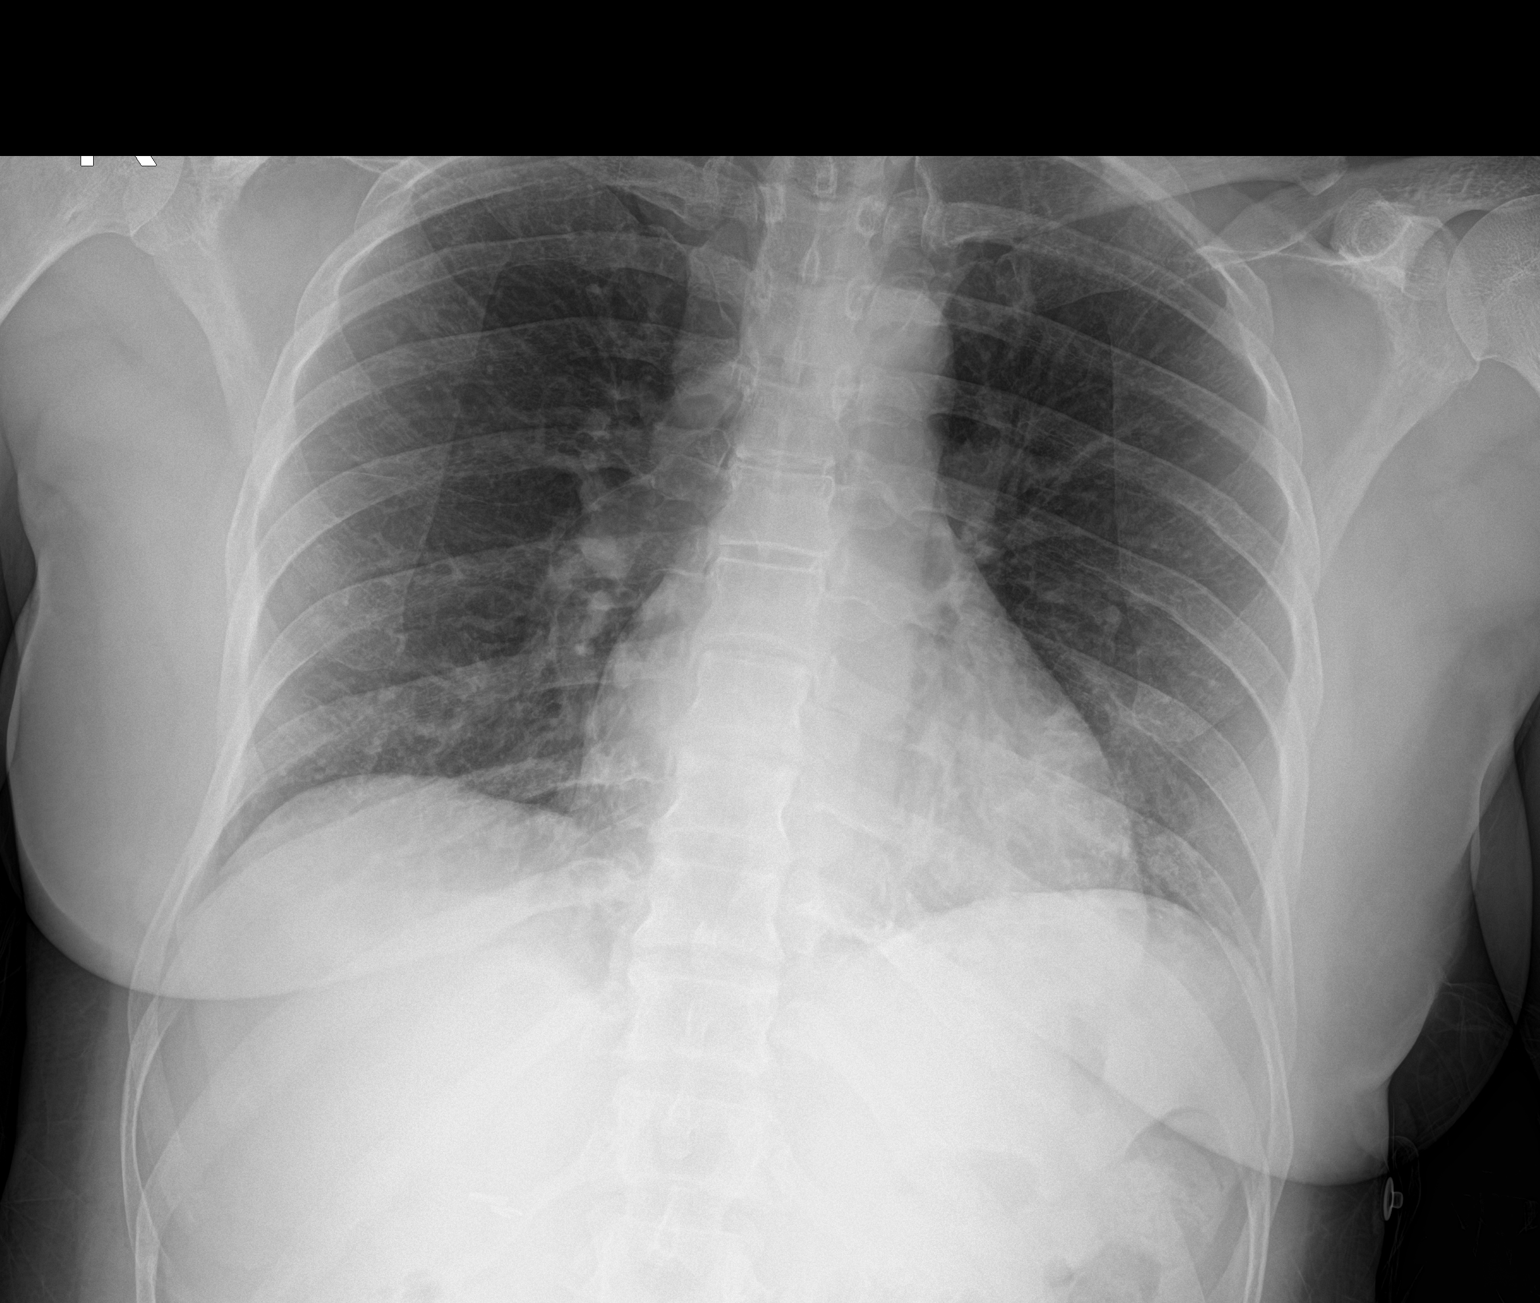

[1 of 1 positions shown; findings below may reference images not displayed]

FINDINGS: The small right apical pneumothorax seen on chest radiograph 1 day
prior is not appreciable on current examination. Lungs are clear.
Heart size and pulmonary vascular normal. No adenopathy. There is a
fracture of the lateral right clavicle. Rather subtle fractures of
the left posterior fifth and sixth ribs noted.
IMPRESSION: Posterior left fifth and sixth ribs as well as lateral right
clavicle fractures noted. No pneumothorax evident currently. Lungs
clear. Cardiac silhouette normal.

## 2023-02-08 ENCOUNTER — Ambulatory Visit (HOSPITAL_COMMUNITY): Payer: Self-pay

## 2023-03-22 ENCOUNTER — Other Ambulatory Visit: Payer: Self-pay | Admitting: Cardiology

## 2023-03-22 ENCOUNTER — Encounter: Payer: Self-pay | Admitting: Cardiology

## 2023-03-22 ENCOUNTER — Other Ambulatory Visit: Payer: Self-pay | Admitting: *Deleted

## 2023-03-22 DIAGNOSIS — Z13220 Encounter for screening for lipoid disorders: Secondary | ICD-10-CM

## 2023-03-22 NOTE — Progress Notes (Signed)
Lipid screening completed. See cardiovascular screening flowsheet. 

## 2023-03-24 LAB — LIPID PANEL W/O CHOL/HDL RATIO
Cholesterol, Total: 194 mg/dL (ref 100–199)
HDL: 70 mg/dL (ref >39)
LDL Chol Calc (NIH): 108 mg/dL — ABNORMAL HIGH (ref 0–99)
Triglycerides: 92 mg/dL (ref 0–149)
VLDL Cholesterol Cal: 16 mg/dL (ref 5–40)

## 2023-03-26 ENCOUNTER — Telehealth: Payer: Self-pay

## 2023-03-26 NOTE — Telephone Encounter (Signed)
Called patient via to give lab results from free screening. Left name and number for patient to call back.

## 2023-03-31 ENCOUNTER — Telehealth: Payer: Self-pay

## 2023-03-31 NOTE — Telephone Encounter (Signed)
Called patient via to give lab results from free screening.  Total Cholesterol: 187 Triglycerides: 169 HDL Cholesterol: 46 LDL Cholesterol: 111   Spoke with patient about lab results. Informed patient that LDL is slightly elevated. Encouraged patient to decrease the amount of fried and fatty foods consumed as well as adding in 20-30 minutes of daily exercise. Patient was seeing Romie Jumper w/ [id number] but has not seen her since Sept. 2023. 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 Will mail patient list of PCP offices that she can reach out to to establish care.   Patient voiced understanding.

## 2023-04-03 NOTE — Progress Notes (Signed)
This encounter entered to address patient needing PCP resources only in follow up. Patient attended 03/22/2023 [facility] screening event and a lipid test was performed - that result has already been addressed. 04/15/23- Pt's lipid results from the 03/22/23 screening event indicated need for f/u per Dr. Servando Salina. [facility] community outreach team spoke to pt and sent her PCP info on 03/31/23. However, pt said having a PCP close to where she works on the [employer] would help her. The community primary care clinic flyer was mailed to her at her request to review because both the [facility] and the [facility] are on the [facility], with the [facility] being located in [facility]. Pt verbalized her intention to review this info and then call to make an appt at he clinic of her choice.

## 2023-06-10 ENCOUNTER — Encounter: Payer: Self-pay | Admitting: *Deleted

## 2023-06-10 NOTE — Progress Notes (Signed)
Pt attended 03/22/23 screening event where her bp was 120/80 and pt had additional lipid screening test done, f/u by cardiologist Dr. Servando Salina and by community cancer outreach team. After the event, the pt indicated she needed to f/u with a PCP and PCP contact info near [facility] was mailed to her at her request. During this 60 day event f/u today, the pt noted during the phone call that she had a physical last year but that her former PCP was no longer in network with her insurance. Chart review indicated pt had annual physical last year  at [facility] on 05/24/2022, so it was time again for her annual check up. Pt asked health equity team member to send the [facility] primary care info again, so letter sent with [facility] and community primary care clinic flyers for her to review and then call or go on line to make a future appt to get established and perhaps have her annual physical soon, as needed.

## 2023-06-17 DIAGNOSIS — M654 Radial styloid tenosynovitis [de Quervain]: Secondary | ICD-10-CM | POA: Diagnosis not present

## 2023-11-04 ENCOUNTER — Encounter: Payer: Self-pay | Admitting: *Deleted

## 2023-11-04 NOTE — Progress Notes (Signed)
Pt attended 03/22/23 screening event where she had a lipid screening done. At the 60day event f/u, pt shared her last PCP was no longer covered by her insurance and asked for [facility] primary care resources to be mailed to her for her to review some PCP options. During the 6 month f/u today, health equity team member unable to contact pt by phone and chart review reveal only an ortho f/u appt since the original event was documented but there are no [facility] -visible primary care encounters since that time. Final letter with [facility] and [facility] community primary care clinic resources mailed to pt again in case she has not yet established care with a PCP. No known SDOH barriers documented in chart. No additional health equity team support scheduled at this time. Pt returned call, asked for PCP location near her work, decided she wanted to try the [facility] on [address] and wanted to call for appt herself to work with her schedule- [facility] contact info given to pt during phone call. Pt thanked health equity team and stated she would call for appt.

## 2023-12-16 ENCOUNTER — Telehealth: Payer: Self-pay

## 2023-12-16 NOTE — Telephone Encounter (Signed)
 Per the pt and DR Tenny Craw.. pt asking to be seen by Dr Tenny Craw.... she saw Dr Tenny Craw outside of work and asked to be seen for her family history and her recent chest pain symptoms.   Pt only wants to be seen in [place] for employee privacy.... I was only able to get her in on a day Dr Tenny Craw is rounding at [facility] and add her in for 12/30/23 at 2:15 pm.

## 2023-12-19 ENCOUNTER — Ambulatory Visit: Payer: PRIVATE HEALTH INSURANCE | Admitting: Family Medicine

## 2023-12-19 ENCOUNTER — Encounter: Payer: Self-pay | Admitting: Family Medicine

## 2023-12-19 VITALS — BP 108/72 | HR 81 | Temp 98.6°F | Ht 64.0 in | Wt 205.2 lb

## 2023-12-19 DIAGNOSIS — E66812 Obesity, class 2: Secondary | ICD-10-CM | POA: Diagnosis not present

## 2023-12-19 DIAGNOSIS — Z79899 Other long term (current) drug therapy: Secondary | ICD-10-CM | POA: Diagnosis not present

## 2023-12-19 DIAGNOSIS — M722 Plantar fascial fibromatosis: Secondary | ICD-10-CM

## 2023-12-19 DIAGNOSIS — Z6835 Body mass index (BMI) 35.0-35.9, adult: Secondary | ICD-10-CM | POA: Diagnosis not present

## 2023-12-19 DIAGNOSIS — K219 Gastro-esophageal reflux disease without esophagitis: Secondary | ICD-10-CM

## 2023-12-19 DIAGNOSIS — E6609 Other obesity due to excess calories: Secondary | ICD-10-CM

## 2023-12-19 DIAGNOSIS — E559 Vitamin D deficiency, unspecified: Secondary | ICD-10-CM

## 2023-12-19 DIAGNOSIS — E538 Deficiency of other specified B group vitamins: Secondary | ICD-10-CM

## 2023-12-19 MED ORDER — NABUMETONE 500 MG PO TABS
500.0000 mg | ORAL_TABLET | Freq: Two times a day (BID) | ORAL | 1 refills | Status: DC | PRN
Start: 2023-12-19 — End: 2024-03-18

## 2023-12-19 MED ORDER — PREDNISONE 10 MG (21) PO TBPK
ORAL_TABLET | Freq: Every day | ORAL | 0 refills | Status: AC
Start: 2023-12-19 — End: 2023-12-25

## 2023-12-19 NOTE — Progress Notes (Signed)
 New Patient Office Visit  Subjective    Patient ID: Aimee Sanchez, female    DOB: 1971-01-13  Age: 53 y.o. MRN: [id number]  CC:  Chief Complaint  Patient presents with   Establish Care    HPI Aimee Sanchez presents to establish care Reports compliance with medication regimen.  Reports L heel pain for the last few months.  States that the pain is a sharp shooting pain that starts at the bottom of her foot and goes up through her left heel. 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 Has not attempted OTC treatment. Denies numbness, tingling, loss of strength in the foot, known injury, redness, swelling, other symptoms Denies previous symptoms. Denies other concerns today.  Outpatient Encounter Medications as of 12/19/2023  Medication Sig   acetaminophen (TYLENOL) 500 MG tablet Take 2 tablets (1,000 mg total) by mouth every 6 (six) hours.   [employer] VITAMIN B12 1000 MCG tablet Take 1,000 mcg by mouth daily.   ibuprofen (ADVIL) 600 MG tablet Take 1 tablet (600 mg total) by mouth every 6 (six) hours as needed (pain not controlled with tylenol).   nabumetone (RELAFEN) 500 MG tablet Take 1 tablet (500 mg total) by mouth 2 (two) times daily as needed.   predniSONE (STERAPRED UNI-PAK 21 TAB) 10 MG (21) TBPK tablet Take by mouth daily for 6 days. Take 6 tablets on day 1, 5 tablets on day 2, 4 tablets on day 3, 3 tablets on day 4, 2 tablets on day 5, 1 tablet on day 6   [DISCONTINUED] methylPREDNISolone (MEDROL) 4 MG tablet Medrol dose pack. Take as instructed (Patient not taking: Reported on 12/19/2023)   [DISCONTINUED] oxyCODONE (OXY IR/ROXICODONE) 5 MG immediate release tablet Take 1 tablet (5 mg total) by mouth every 6 (six) hours as needed (pain not controlled with tylenol and ibuprofen first). (Patient not taking: Reported on 12/19/2023)   [DISCONTINUED] tiZANidine (ZANAFLEX) 4 MG tablet Take 1 tablet (4 mg total) by mouth every 8 (eight) hours as needed for muscle spasms.  (Patient not taking: Reported on 12/19/2023)   [DISCONTINUED] traMADol (ULTRAM) 50 MG tablet Take 1-2 tablets (50-100 mg total) by mouth every 6 (six) hours as needed. (Patient not taking: Reported on 12/19/2023)   [DISCONTINUED] Vitamin D, Ergocalciferol, (DRISDOL) 1.25 MG (50000 UNIT) CAPS capsule Take 50,000 Units by mouth once a week. (Patient not taking: Reported on 12/19/2023)   No facility-administered encounter medications on file as of 12/19/2023.    History reviewed. No pertinent past medical history.  Past Surgical History:  Procedure Laterality Date   CHOLECYSTECTOMY  2021   ROTATOR CUFF REPAIR Right     History reviewed. No pertinent family history.  Social History   Socioeconomic History   Marital status: Married    Spouse name: Not on file   Number of children: Not on file   Years of education: Not on file   Highest education level: Not on file  Occupational History   Not on file  Tobacco Use   Smoking status: Never   Smokeless tobacco: Never  Substance and Sexual Activity   Alcohol use: No   Drug use: No   Sexual activity: Yes    Birth control/protection: Condom  Other Topics Concern   Not on file  Social History Narrative   Not on file   Social Drivers of Health   Financial Resource Strain: Not on file  Food Insecurity: No Food Insecurity (05/24/2022)   [facility], [facility]  Hunger Vital Sign    Worried About Running Out of Food in the Last Year: Never true    Ran Out of Food in the Last Year: Never true  Transportation Needs: Not on file  Physical Activity: Not on file  Stress: Not on file  Social Connections: Unknown (02/05/2022)   Received from [facility], [facility]   Social Network    Social Network: Not on file  Intimate Partner Violence: Unknown (12/28/2021)   Received from [facility], [facility]   HITS    Physically Hurt: Not on file    Insult or Talk Down To: Not on file    Threaten Physical Harm: Not on  file    Scream or Curse: Not on file    ROS Per HPI      Objective    BP 108/72 (BP Location: Left Arm, Patient Position: Sitting)   Pulse 81   Temp 98.6 F (37 C) (Temporal)   Ht 5\' 4"  (1.626 m)   Wt 205 lb 3.2 oz (93.1 kg)   SpO2 96%   BMI 35.22 kg/m   Physical Exam Vitals and nursing note reviewed.  Constitutional:      General: She is not in acute distress.    Appearance: Normal appearance. She is obese.  HENT:     Head: Normocephalic and atraumatic.     Right Ear: External ear normal.     Left Ear: External ear normal.  Eyes:     Extraocular Movements: Extraocular movements intact.  Cardiovascular:     Rate and Rhythm: Normal rate and regular rhythm.     Pulses: Normal pulses.     Heart sounds: Normal heart sounds.  Pulmonary:     Effort: Pulmonary effort is normal. No respiratory distress.     Breath sounds: Normal breath sounds. No wheezing, rhonchi or rales.  Musculoskeletal:        General: Tenderness (L heel, mildly swollen, no erythema, no bruising, no obvious deformity) present. Normal range of motion.     Cervical back: Normal range of motion.     Right lower leg: No edema.     Left lower leg: No edema.  Lymphadenopathy:     Cervical: No cervical adenopathy.  Neurological:     General: No focal deficit present.     Mental Status: She is alert and oriented to person, place, and time.  Psychiatric:        Mood and Affect: Mood normal.        Thought Content: Thought content normal.        Assessment & Plan:   Plantar fasciitis of left foot Assessment & Plan: Discussed exercises, treatment  Orders: -     predniSONE; Take by mouth daily for 6 days. Take 6 tablets on day 1, 5 tablets on day 2, 4 tablets on day 3, 3 tablets on day 4, 2 tablets on day 5, 1 tablet on day 6  Dispense: 21 tablet; Refill: 0 -     Nabumetone; Take 1 tablet (500 mg total) by mouth 2 (two) times daily as needed.  Dispense: 60 tablet; Refill: 1  Class 2 obesity due to  excess calories with body mass index (BMI) of 35.0 to 35.9 in adult, unspecified whether serious comorbidity present  Medication management   PF rehab exercise handout given Will send to sports medicine as needed Continue efforts in healthy diet and activity level  Return for schedule physical.   Sherald Barge, FNP

## 2023-12-19 NOTE — Patient Instructions (Addendum)
 Welcome to [facility]!  Thank you for choosing [place] for your Primary Care needs.   We offer in person and video appointments for your convenience. You may call our office to schedule appointments, or you may schedule appointments with me through MyChart.   The best way to get in contact with me is via MyChart message. This will get to me faster than a phone call, unless there is an emergency, then please call 911.  The lab is located downstairs in the Sports Medicine building, we also have xray available there.   Take by mouth daily for 6 days. Take 6 tablets on day 1, 5 tablets on day 2, 4 tablets on day 3, 3 tablets on day 4, 2 tablets on day 5, 1 tablet on day 6.  I have sent in nabumetone for you to take twice a day as needed for pain and inflammation.   Follow-up with me for new or worsening symptoms.

## 2023-12-22 DIAGNOSIS — E6609 Other obesity due to excess calories: Secondary | ICD-10-CM | POA: Insufficient documentation

## 2023-12-22 DIAGNOSIS — Z79899 Other long term (current) drug therapy: Secondary | ICD-10-CM | POA: Insufficient documentation

## 2023-12-22 DIAGNOSIS — M722 Plantar fascial fibromatosis: Secondary | ICD-10-CM | POA: Insufficient documentation

## 2023-12-22 NOTE — Assessment & Plan Note (Signed)
 Discussed exercises, treatment

## 2023-12-30 ENCOUNTER — Ambulatory Visit: Admitting: Internal Medicine

## 2024-01-05 ENCOUNTER — Other Ambulatory Visit (HOSPITAL_COMMUNITY): Payer: Self-pay

## 2024-02-03 ENCOUNTER — Ambulatory Visit: Admitting: Internal Medicine

## 2024-03-18 ENCOUNTER — Ambulatory Visit: Attending: Internal Medicine | Admitting: Internal Medicine

## 2024-03-18 ENCOUNTER — Encounter: Payer: Self-pay | Admitting: Internal Medicine

## 2024-03-18 VITALS — BP 110/76 | HR 72 | Ht 64.5 in | Wt 205.0 lb

## 2024-03-18 DIAGNOSIS — Z13220 Encounter for screening for lipoid disorders: Secondary | ICD-10-CM

## 2024-03-18 DIAGNOSIS — Z131 Encounter for screening for diabetes mellitus: Secondary | ICD-10-CM

## 2024-03-18 DIAGNOSIS — R079 Chest pain, unspecified: Secondary | ICD-10-CM
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
## 2024-03-18 NOTE — Patient Instructions (Signed)
 Medication Instructions:  Your physician recommends that you continue on your current medications as directed. Please refer to the Current Medication list given to you today.  *If you need a refill on your cardiac medications before your next appointment, please call your pharmacy*  Lab Work: Your physician recommends that you return for lab work in: Today   If you have labs (blood work) drawn today and your tests are completely normal, you will receive your results only by: MyChart Message (if you have MyChart) OR A paper copy in the mail If you have any lab test that is abnormal or we need to change your treatment, we will call you to review the results.  Testing/Procedures: NONE   Follow-Up: At [facility], you and your health needs are our priority.  As part of our continuing mission to provide you with exceptional heart care, our providers are all part of one team.  This team includes your primary Cardiologist (physician) and Advanced Practice Providers or APPs (Physician Assistants and Nurse Practitioners) who all work together to provide you with the care you need, when you need it.  Your next appointment:    To Be Determined   Provider:   Ola Berger, MD    We recommend signing up for the patient portal called MyChart.  Sign up information is provided on this After Visit Summary.  MyChart is used to connect with patients for Virtual Visits (Telemedicine).  Patients are able to view lab/test results, encounter notes, upcoming appointments, etc.  Non-urgent messages can be sent to your provider as well.   To learn more about what you can do with MyChart, go to [email or web address].   Other Instructions Thank you for choosing [facility]!

## 2024-03-24 ENCOUNTER — Ambulatory Visit: Admitting: Internal Medicine

## 2024-03-25 ENCOUNTER — Telehealth: Payer: Self-pay

## 2024-03-25 DIAGNOSIS — R0602 Shortness of breath: Secondary | ICD-10-CM

## 2024-03-25 DIAGNOSIS — R600 Localized edema: Secondary | ICD-10-CM

## 2024-03-25 NOTE — Telephone Encounter (Signed)
 Echo order placed.

## 2024-03-25 NOTE — Telephone Encounter (Signed)
-----   Message from Vina Gull sent at 03/18/2024 10:44 PM EDT ----- Please refer pt to [name]   I have spoken to him   Eval of LLE edema

## 2024-03-25 NOTE — Telephone Encounter (Signed)
-----   Message from Vina Gull sent at 03/18/2024 10:45 PM EDT ----- PT needs echo   FHx of CHF Can you set up for Aimee Sanchez to do?

## 2024-03-25 NOTE — Telephone Encounter (Signed)
 Referral placed.

## 2024-03-29 ENCOUNTER — Other Ambulatory Visit: Payer: Self-pay

## 2024-03-29 DIAGNOSIS — Z13220 Encounter for screening for lipoid disorders: Secondary | ICD-10-CM

## 2024-03-29 DIAGNOSIS — R0602 Shortness of breath: Secondary | ICD-10-CM

## 2024-03-29 DIAGNOSIS — Z131 Encounter for screening for diabetes mellitus: Secondary | ICD-10-CM

## 2024-03-29 DIAGNOSIS — R079 Chest pain, unspecified: Secondary | ICD-10-CM

## 2024-03-29 DIAGNOSIS — R600 Localized edema: Secondary | ICD-10-CM

## 2024-03-30 LAB — LIPOPROTEIN A (LPA): Lipoprotein (a): 176.1 nmol/L — ABNORMAL HIGH (ref <75.0)

## 2024-03-30 LAB — NMR, LIPOPROFILE
Cholesterol, Total: 218 mg/dL — ABNORMAL HIGH (ref 100–199)
HDL Particle Number: 43.6 umol/L (ref >=30.5)
HDL-C: 72 mg/dL (ref >39)
LDL Particle Number: 1373 nmol/L — ABNORMAL HIGH (ref <1000)
LDL Size: 21.6 nm (ref >20.5)
LDL-C (NIH Calc): 121 mg/dL — ABNORMAL HIGH (ref 0–99)
LP-IR Score: 42 (ref <=45)
Small LDL Particle Number: 373 nmol/L (ref <=527)
Triglycerides: 142 mg/dL (ref 0–149)

## 2024-03-30 LAB — APOLIPOPROTEIN B: Apolipoprotein B: 87 mg/dL (ref <90)

## 2024-03-30 LAB — HEMOGLOBIN A1C
Est. average glucose Bld gHb Est-mCnc: 126 mg/dL
Hgb A1c MFr Bld: 6 % — ABNORMAL HIGH (ref 4.8–5.6)

## 2024-04-02 ENCOUNTER — Ambulatory Visit (HOSPITAL_COMMUNITY)
Admission: RE | Admit: 2024-04-02 | Discharge: 2024-04-02 | Disposition: A | Discharge status: Home / Self Care | Source: Ambulatory Visit | Attending: Internal Medicine | Admitting: Internal Medicine

## 2024-04-02 DIAGNOSIS — R0602 Shortness of breath: Secondary | ICD-10-CM | POA: Insufficient documentation

## 2024-04-02 LAB — ECHOCARDIOGRAM COMPLETE
Area-P 1/2: 3.68 cm2
S' Lateral: 2.75 cm

## 2024-04-06 ENCOUNTER — Ambulatory Visit: Payer: Self-pay | Admitting: Internal Medicine

## 2024-04-06 DIAGNOSIS — Z13220 Encounter for screening for lipoid disorders: Secondary | ICD-10-CM

## 2024-04-14 ENCOUNTER — Ambulatory Visit: Payer: Self-pay | Admitting: Internal Medicine

## 2024-04-17 ENCOUNTER — Ambulatory Visit: Admitting: Internal Medicine

## 2024-04-26 NOTE — Telephone Encounter (Signed)
 Sent to schedulers to set up [facility] and follow up on sports medicine referral.

## 2024-05-06 ENCOUNTER — Ambulatory Visit (HOSPITAL_COMMUNITY)

## 2024-05-14 ENCOUNTER — Other Ambulatory Visit (HOSPITAL_BASED_OUTPATIENT_CLINIC_OR_DEPARTMENT_OTHER)

## 2024-05-20 ENCOUNTER — Encounter: Admitting: Family Medicine

## 2024-05-20 DIAGNOSIS — Z0000 Encounter for general adult medical examination without abnormal findings: Secondary | ICD-10-CM | POA: Insufficient documentation

## 2024-05-20 NOTE — Patient Instructions (Incomplete)
 We have completed your physical today.  I have ordered labs for you to do when you have time later.   Please call the breast center and make an appointment for a screening mammogram.   Continue current medication regimen.   Follow up with me in year for your next physical, sooner if needed.

## 2024-05-20 NOTE — Progress Notes (Deleted)
 Annual Physical Exam  Subjective:     Patient ID: Aimee Sanchez, female    DOB: May 08, 1971, 53 y.o.   MRN: [id number]  No chief complaint on file.   HPI  Discussed the use of AI scribe software for clinical note transcription with the patient, who gave verbal consent to proceed.  History of Present Illness      ROS Per HPI  Most recent fall risk assessment:    12/19/2023    3:13 PM  Fall Risk   Falls in the past year? 0  Number falls in past yr: 0  Injury with Fall? 0    Most recent depression screenings:    12/19/2023    3:13 PM  PHQ 2/9 Scores  PHQ - 2 Score 1  PHQ- 9 Score 2    Vision:{Last Opthalmologist Visit:27382} and Dental: {CHL AMB ROS DENTAL (Optional):27383::No current dental problems}  Patient Care Team: Alvia Corean CROME, FNP as PCP - General (Family Medicine) Claudene Arthea HERO, DO as Consulting Physician (Family Medicine)   No outpatient medications prior to visit.   No facility-administered medications prior to visit.       Objective:    There were no vitals taken for this visit.   Physical Exam Vitals and nursing note reviewed.  Constitutional:      General: She is not in acute distress.    Appearance: Normal appearance. She is normal weight.  HENT:     Head: Normocephalic and atraumatic.     Right Ear: External ear normal.     Left Ear: External ear normal.     Nose: Nose normal.     Mouth/Throat:     Mouth: Mucous membranes are moist.     Pharynx: Oropharynx is clear.  Eyes:     Extraocular Movements: Extraocular movements intact.     Pupils: Pupils are equal, round, and reactive to light.  Cardiovascular:     Rate and Rhythm: Normal rate and regular rhythm.     Pulses: Normal pulses.     Heart sounds: Normal heart sounds.  Pulmonary:     Effort: Pulmonary effort is normal. No respiratory distress.     Breath sounds: Normal breath sounds. No wheezing, rhonchi or rales.  Musculoskeletal:        General:  Normal range of motion.     Cervical back: Normal range of motion.     Right lower leg: No edema.     Left lower leg: No edema.  Lymphadenopathy:     Cervical: No cervical adenopathy.  Neurological:     General: No focal deficit present.     Mental Status: She is alert and oriented to person, place, and time.  Psychiatric:        Mood and Affect: Mood normal.        Thought Content: Thought content normal.     No results found for any visits on 05/20/24.  BP Readings from Last 3 Encounters:  03/18/24 110/76  12/19/23 108/72  04/02/20 120/80   Wt Readings from Last 3 Encounters:  03/18/24 205 lb (93 kg)  12/19/23 205 lb 3.2 oz (93.1 kg)  04/10/20 185 lb (83.9 kg)      Last CBC Lab Results  Component Value Date   WBC 19.4 (H) 04/01/2020   HGB 12.1 04/01/2020   HCT 36.9 04/01/2020   MCV 90.4 04/01/2020   MCH 29.7 04/01/2020   RDW 12.7 04/01/2020   PLT 323 04/01/2020   Last metabolic panel Lab  Results  Component Value Date   GLUCOSE 120 (H) 04/01/2020   NA 134 (L) 04/01/2020   K 4.8 04/01/2020   CL 104 04/01/2020   CO2 22 04/01/2020   BUN 16 04/01/2020   CREATININE 1.09 (H) 04/01/2020   GFRNONAA 60 (L) 04/01/2020   CALCIUM 9.0 04/01/2020   PROT 6.6 04/01/2020   ALBUMIN 3.7 04/01/2020   BILITOT 0.4 04/01/2020   ALKPHOS 38 04/01/2020   AST 30 04/01/2020   ALT 18 04/01/2020   ANIONGAP 8 04/01/2020   Last lipids Lab Results  Component Value Date   CHOL 194 03/22/2023   HDL 70 03/22/2023   LDLCALC 108 (H) 03/22/2023   TRIG 92 03/22/2023   Last hemoglobin A1c Lab Results  Component Value Date   HGBA1C 6.0 (H) 03/29/2024   Last thyroid functions No results found for: TSH, T3TOTAL, T4TOTAL, THYROIDAB Last vitamin D No results found for: 25OHVITD2, 25OHVITD3, VD25OH Last vitamin B12 and Folate No results found for: VITAMINB12, FOLATE       Assessment & Plan:   Assessment and Plan Assessment & Plan      Health Maintenance   Topic Date Due   Hepatitis C Screening  Never done   DTaP/Tdap/Td vaccine (1 - Tdap) Never done   Hepatitis B Vaccine (1 of 3 - 19+ 3-dose series) Never done   Zoster (Shingles) Vaccine (1 of 2) Never done   Colon Cancer Screening  Never done   COVID-19 Vaccine (3 - Pfizer risk series) 02/22/2020   Pneumococcal Vaccine for age over 42 (1 of 1 - PCV) Never done   Mammogram  Never done   Flu Shot  04/23/2024   Pap with HPV screening  12/17/2025   HIV Screening  Completed   HPV Vaccine  Aged Out   Meningitis B Vaccine  Aged Out     Discussed health benefits of physical activity, and encouraged her to engage in regular exercise appropriate for her age and condition.  No orders of the defined types were placed in this encounter.    No orders of the defined types were placed in this encounter.   No follow-ups on file.  Corean LITTIE Ku, FNP

## 2024-05-21 ENCOUNTER — Encounter: Admitting: Family Medicine

## 2024-05-25 ENCOUNTER — Encounter: Admitting: Family Medicine
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
## 2024-06-14 ENCOUNTER — Other Ambulatory Visit (HOSPITAL_BASED_OUTPATIENT_CLINIC_OR_DEPARTMENT_OTHER)

## 2024-06-14 ENCOUNTER — Ambulatory Visit: Admitting: Family Medicine

## 2024-06-14 ENCOUNTER — Other Ambulatory Visit: Payer: Self-pay

## 2024-06-14 VITALS — BP 122/60 | HR 81 | Temp 97.8°F | Ht 64.5 in | Wt 198.8 lb

## 2024-06-14 DIAGNOSIS — M7122 Synovial cyst of popliteal space [Baker], left knee: Secondary | ICD-10-CM | POA: Diagnosis not present

## 2024-06-14 DIAGNOSIS — M79605 Pain in left leg: Secondary | ICD-10-CM

## 2024-06-14 DIAGNOSIS — R600 Localized edema: Secondary | ICD-10-CM | POA: Diagnosis not present

## 2024-06-14 NOTE — Patient Instructions (Signed)
 Watch for baker cyst to come back Tart Cherry 2400mg  daily May want to ice the knee at the end of a long day Compression stockings See you again in 2 months

## 2024-06-15 ENCOUNTER — Encounter: Payer: Self-pay | Admitting: Family Medicine

## 2024-06-15 DIAGNOSIS — M7122 Synovial cyst of popliteal space [Baker], left knee: Secondary | ICD-10-CM | POA: Insufficient documentation

## 2024-06-15 DIAGNOSIS — R600 Localized edema: Secondary | ICD-10-CM | POA: Insufficient documentation

## 2024-06-15 NOTE — Assessment & Plan Note (Signed)
 Continue to monitor, worsening will consider aspiration if not completely ruptured.  Discussed compression.  Discussed the pathology and prognosis.  Worsening discomfort or intermittent locking possible advanced imaging would be warranted

## 2024-06-15 NOTE — Assessment & Plan Note (Signed)
 I believe it is secondary to an intermittent rupturing of the Baker's cyst.  We discussed the signs and symptoms in the possibility of aspiration before Baker's cyst ruptures.  At the moment with it ruptured and unable to do aspiration.  We discussed compression, exercises, discussed x-rays of the knee.  No need for other medication at this moment.  Differential includes other pathologies such as lumbar radiculopathy.  Does not have any signs or recent history that would make me to concern for any type of blood clot but patient does know the signs and symptoms and when to seek medical attention.  Follow-up with me again in 6 to 8 weeks otherwise.

## 2024-08-13 NOTE — Progress Notes (Unsigned)
 [facility] [address] [place] [address] Phone: [phone] Subjective:   Aimee Sanchez, am serving as a scribe for Dr. Arthea Aimee.  I'Aimee Sanchez seeing this patient by the request  of:  Aimee Corean CROME, FNP  CC: Left knee and leg extremity swelling.  YEP:Dlagzrupcz  06/14/2024 Continue to monitor, worsening will consider aspiration if not completely ruptured.  Discussed compression.  Discussed the pathology and prognosis.  Worsening discomfort or intermittent locking possible advanced imaging would be warranted     I believe it is secondary to an intermittent rupturing of the Baker's cyst.  We discussed the signs and symptoms in the possibility of aspiration before Baker's cyst ruptures.  At the moment with it ruptured and unable to do aspiration.  We discussed compression, exercises, discussed x-rays of the knee.  No need for other medication at this moment.  Differential includes other pathologies such as lumbar radiculopathy.  Does not have any signs or recent history that would make me to concern for any type of blood clot but patient does know the signs and symptoms and when to seek medical attention.  Follow-up with me again in 6 to 8 weeks otherwise.     Updated 08/18/2024 Aimee Sanchez is a 53 y.o. female coming in with complaint of L knee pain and B lower leg swelling. States that her knee is doing well. L hip has started to hurt over lateral aspect. Using tart cherry extract. Some swelling in knee.   L hip pain with getting in and out of bed and when sitting.        No past medical history on file. Past Surgical History:  Procedure Laterality Date   CHOLECYSTECTOMY  2021   ROTATOR CUFF REPAIR Right    Social History   Socioeconomic History   Marital status: Married    Spouse name: Not on file   Number of children: Not on file   Years of education: Not on file   Highest education level: Not on file  Occupational  History   Not on file  Tobacco Use   Smoking status: Never   Smokeless tobacco: Never  Vaping Use   Vaping status: Never Used  Substance and Sexual Activity   Alcohol use: Not Currently    Comment: occasional social   Drug use: No   Sexual activity: Yes    Birth control/protection: Condom  Other Topics Concern   Not on file  Social History Narrative   Not on file   Social Drivers of Health   Financial Resource Strain: Not on file  Food Insecurity: No Food Insecurity (05/24/2022)   [facility]   Hunger Vital Sign    Within the past 12 months, you worried that your food would run out before you got the money to buy more.: Never true    Within the past 12 months, the food you bought just didn't last and you didn't have money to get more.: Never true  Transportation Needs: Not on file  Physical Activity: Not on file  Stress: Not on file  Social Connections: Not on file   No Known Allergies Family History  Problem Relation Age of Onset   Hypertension Mother    Diabetes Mother    Heart failure Mother    Heart failure Father    Healthy Sister    Healthy Sister    Healthy Brother    Healthy Brother    No current outpatient medications on  file.   Reviewed prior external information including notes and imaging from  primary care provider As well as notes that were available from care everywhere and other healthcare systems.  Past medical history, social, surgical and family history all reviewed in electronic medical record.  No pertanent information unless stated regarding to the chief complaint.   Review of Systems:  No headache, visual changes, nausea, vomiting, diarrhea, constipation, dizziness, abdominal pain, skin rash, fevers, chills, night sweats, weight loss, swollen lymph nodes, body aches, joint swelling, chest pain, shortness of breath, mood changes. POSITIVE muscle aches  Objective  There were no vitals taken for this visit.   General: No  apparent distress alert and oriented x3 mood and affect normal, dressed appropriately.  HEENT: Pupils equal, extraocular movements intact  Respiratory: Patient's speak in full sentences and does not appear short of breath  Cardiovascular: No lower extremity edema, non tender, no erythema  Leg exam shows  Limited muscular skeletal ultrasound was performed and interpreted by Aimee Sanchez, Aimee Sanchez      Impression and Recommendations:    The above documentation has been reviewed and is accurate and complete Aimee Sanchez Aimee Sanchez Aimee Makarewicz, DO

## 2024-08-18 ENCOUNTER — Ambulatory Visit: Admitting: Family Medicine

## 2024-08-18 ENCOUNTER — Other Ambulatory Visit: Payer: Self-pay

## 2024-08-18 VITALS — BP 102/56 | HR 79 | Ht 64.5 in | Wt 198.0 lb

## 2024-08-18 DIAGNOSIS — M7062 Trochanteric bursitis, left hip: Secondary | ICD-10-CM | POA: Diagnosis not present

## 2024-08-18 DIAGNOSIS — M25562 Pain in left knee: Secondary | ICD-10-CM | POA: Diagnosis not present

## 2024-08-18 DIAGNOSIS — M7122 Synovial cyst of popliteal space [Baker], left knee: Secondary | ICD-10-CM

## 2024-08-18 DIAGNOSIS — G8929 Other chronic pain: Secondary | ICD-10-CM

## 2024-08-18 NOTE — Patient Instructions (Addendum)
 Injected hip  Exercises See me in 3 months

## 2024-08-19 ENCOUNTER — Encounter: Payer: Self-pay | Admitting: Family Medicine

## 2024-08-19 DIAGNOSIS — M7062 Trochanteric bursitis, left hip: Secondary | ICD-10-CM | POA: Insufficient documentation

## 2024-08-19 NOTE — Assessment & Plan Note (Signed)
 Patient has responded extremely well to the injection previously from an outside facility but this is a new problem to me.  Discussed hip abductor strengthening which patient has not done before.  Discussed posture and ergonomics, increase activity slowly.  Follow-up with me again in 6 to 12 weeks

## 2024-08-19 NOTE — Assessment & Plan Note (Signed)
 Resolved on exam today.  Continue to monitor.  Nothing over the patella make any other significant changes.  Follow-up with me again in 6 to 12 weeks

## 2024-10-13 ENCOUNTER — Encounter: Payer: Self-pay | Admitting: Physician Assistant

## 2024-10-13 ENCOUNTER — Other Ambulatory Visit (HOSPITAL_COMMUNITY): Payer: Self-pay

## 2024-10-13 ENCOUNTER — Ambulatory Visit: Admitting: Physician Assistant

## 2024-10-13 VITALS — BP 134/84 | HR 91 | Ht 64.0 in | Wt 201.0 lb

## 2024-10-13 DIAGNOSIS — N3001 Acute cystitis with hematuria: Secondary | ICD-10-CM

## 2024-10-13 DIAGNOSIS — Z6834 Body mass index (BMI) 34.0-34.9, adult: Secondary | ICD-10-CM | POA: Diagnosis not present

## 2024-10-13 DIAGNOSIS — E6609 Other obesity due to excess calories: Secondary | ICD-10-CM

## 2024-10-13 DIAGNOSIS — E66811 Obesity, class 1: Secondary | ICD-10-CM

## 2024-10-13 DIAGNOSIS — R300 Dysuria: Secondary | ICD-10-CM

## 2024-10-13 LAB — POCT URINALYSIS DIP (CLINITEK)
Bilirubin, UA: NEGATIVE
Glucose, UA: NEGATIVE mg/dL
Ketones, POC UA: NEGATIVE mg/dL
Nitrite, UA: NEGATIVE
POC PROTEIN,UA: NEGATIVE
Spec Grav, UA: 1.025
Urobilinogen, UA: 0.2 U/dL
pH, UA: 6

## 2024-10-13 MED ORDER — NITROFURANTOIN MONOHYD MACRO 100 MG PO CAPS: 100.0000 mg | ORAL_CAPSULE | Freq: Two times a day (BID) | ORAL | 0 refills | Status: DC

## 2024-10-13 MED ORDER — WEGOVY 1.5 MG PO TABS
1.5000 mg | ORAL_TABLET | Freq: Every day | ORAL | 0 refills | Status: AC
  Filled 2024-10-13: qty 30, 30d supply, fill #0

## 2024-10-13 NOTE — Patient Instructions (Signed)

## 2024-10-13 NOTE — Progress Notes (Signed)
 "  Acute Office Visit  Subjective:     Patient ID: Aimee Sanchez, female    DOB: 11/08/1970, 54 y.o.   MRN: [id number]   HPI .Discussed the use of AI scribe software for clinical note transcription with the patient, who gave verbal consent to proceed.  History of Present Illness Aimee Sanchez is a 54 year old female who presents with symptoms suggestive of a urinary tract infection.  Dysuria and urinary pressure - Pressure and dysuria, particularly at the end of urination, present since Sunday - Initial improvement in symptoms, with recurrence today - History of urinary tract infections, but no recent episodes  Gastrointestinal symptoms - Recent onset of nausea and queasiness beginning Sunday - Nausea has persisted throughout the week but has improved since initial onset - No vomiting since initial onset - Decreased oral intake due to nausea  Medication and allergy history - No regular medications - No known antibiotic allergies  Patient would also like something to help her lose weight. She has tried numerous diets over the years without significant weight loss. She is active but not exercising.     ROS See HPI.      Objective:    BP 134/84   Pulse 91   Ht 5' 4 (1.626 m)   Wt 201 lb (91.2 kg)   SpO2 99%   BMI 34.50 kg/m  BP Readings from Last 3 Encounters:  10/13/24 134/84  08/18/24 (!) 102/56  06/14/24 122/60   Wt Readings from Last 3 Encounters:  10/13/24 201 lb (91.2 kg)  08/18/24 198 lb (89.8 kg)  06/14/24 198 lb 12.8 oz (90.2 kg)      Physical Exam Constitutional:      Appearance: Normal appearance. She is obese.  HENT:     Head: Normocephalic.  Cardiovascular:     Rate and Rhythm: Normal rate and regular rhythm.  Pulmonary:     Effort: Pulmonary effort is normal.     Breath sounds: Normal breath sounds.  Abdominal:     General: There is no distension.     Palpations: Abdomen is soft. There is no mass.     Tenderness: There is  no abdominal tenderness. There is no right CVA tenderness, left CVA tenderness or guarding.  Musculoskeletal:     Right lower leg: No edema.     Left lower leg: No edema.  Neurological:     General: No focal deficit present.     Mental Status: She is alert.     Results for orders placed or performed in visit on 10/13/24  POCT URINALYSIS DIP (CLINITEK)  Result Value Ref Range   Color, UA yellow yellow   Clarity, UA clear clear   Glucose, UA negative negative mg/dL   Bilirubin, UA negative negative   Ketones, POC UA negative negative mg/dL   Spec Grav, UA 8.974 8.989 - 1.025   Blood, UA moderate (A) negative   pH, UA 6.0 5.0 - 8.0   POC PROTEIN,UA negative negative, trace   Urobilinogen, UA 0.2 0.2 or 1.0 E.U./dL   Nitrite, UA Negative Negative   Leukocytes, UA Trace (A) Negative        Assessment & Plan:  SABRASABRADiagnoses and all orders for this visit:  Acute cystitis with hematuria -     Urine Culture -     nitrofurantoin , macrocrystal-monohydrate, (MACROBID ) 100 MG capsule; Take 1 capsule (100 mg total) by mouth 2 (two) times daily. For 5 days.  Dysuria -     POCT  URINALYSIS DIP (CLINITEK) -     Urine Culture  Class 1 obesity due to excess calories without serious comorbidity with body mass index (BMI) of 34.0 to 34.9 in adult -     semaglutide -weight management (WEGOVY ) 1.5 MG tablet; Take 1 tablet (1.5 mg total) by mouth daily. Daily in AM on an empty stomach with 4 oz of water. Do not eat or drink for 30 minutes after dose.   Assessment & Plan Acute urinary tract infection Moderate hematuria and trace leukocytes indicated UTI. Symptoms recurred today. No antibiotic allergies reported. - Ordered urine culture to confirm bacterial cause. - Prescribed antibiotic for UTI, macrobid  bid for 5 days.  - Sent prescription to [facility] pharmacy.  Obesity Discussed Wegovy , noting cost and insurance coverage issues. Advised to wait until fully recovered from current illness before  starting medication. Explained potential side effects and dosing regimen. - Prescribed Wegovy  and sent prescription to [facility] pharmacy. - Advised to wait until fully recovered from current illness before starting Wegovy . - Instructed on Wegovy  dosing: take first thing in the morning with water, wait 30 minutes before eating or drinking anything else. - Discussed cost and insurance coverage options for Wegovy .   Return in about 4 weeks (around 11/10/2024) for weight follow up.  Kayelynn Abdou, PA-C   "

## 2024-10-15 ENCOUNTER — Encounter: Admitting: Family Medicine

## 2024-10-16 LAB — URINE CULTURE

## 2024-10-18 ENCOUNTER — Ambulatory Visit: Payer: Self-pay | Admitting: Physician Assistant

## 2024-10-18 MED ORDER — SULFAMETHOXAZOLE-TRIMETHOPRIM 800-160 MG PO TABS: 1.0000 | ORAL_TABLET | Freq: Two times a day (BID) | ORAL | 0 refills | Status: DC

## 2024-10-18 NOTE — Progress Notes (Signed)
 Attempted call to patient. Left a voice mail message requesting a return call.

## 2024-10-18 NOTE — Progress Notes (Signed)
 Aimee Sanchez,   Your culture came back with bacteria Klebsiella and macrobid  will not completely treat, I sent another antibiotic to pharmacy to start.

## 2024-10-22 ENCOUNTER — Other Ambulatory Visit (HOSPITAL_COMMUNITY): Payer: Self-pay

## 2024-10-22 ENCOUNTER — Ambulatory Visit: Admitting: Physician Assistant

## 2024-10-22 ENCOUNTER — Encounter: Payer: Self-pay | Admitting: Physician Assistant

## 2024-10-22 VITALS — BP 122/72 | HR 69 | Ht 64.0 in | Wt 201.0 lb

## 2024-10-22 DIAGNOSIS — Z131 Encounter for screening for diabetes mellitus: Secondary | ICD-10-CM

## 2024-10-22 DIAGNOSIS — E538 Deficiency of other specified B group vitamins: Secondary | ICD-10-CM | POA: Diagnosis not present

## 2024-10-22 DIAGNOSIS — E66811 Obesity, class 1: Secondary | ICD-10-CM

## 2024-10-22 DIAGNOSIS — E559 Vitamin D deficiency, unspecified: Secondary | ICD-10-CM | POA: Diagnosis not present

## 2024-10-22 DIAGNOSIS — R5383 Other fatigue: Secondary | ICD-10-CM

## 2024-10-22 DIAGNOSIS — G478 Other sleep disorders: Secondary | ICD-10-CM

## 2024-10-22 DIAGNOSIS — E6609 Other obesity due to excess calories: Secondary | ICD-10-CM | POA: Diagnosis not present

## 2024-10-22 DIAGNOSIS — Z13220 Encounter for screening for lipoid disorders: Secondary | ICD-10-CM

## 2024-10-22 DIAGNOSIS — Z6834 Body mass index (BMI) 34.0-34.9, adult: Secondary | ICD-10-CM

## 2024-10-22 NOTE — Progress Notes (Unsigned)
" ° °  Established Patient Office Visit  Subjective   Patient ID: Evelisse Szalkowski, female    DOB: [birth year]  Age: 54 y.o. MRN: [id number]  Chief Complaint  Patient presents with   Medical Management of Chronic Issues    HPI Patient is a 54 yo obese female who presents to the clinic to discuss no energy. She has hx of vitamin D and b12 deficiency. She recently had urinary tract infection that was treated with bactrim . She denies any current symptoms. No fever, chills,body aches. She sleeps at night but does not wake up feeling rested. She is working on weight loss and trying to get wegovy  approved.    ROS See HPI.    Objective:     BP 122/72   Pulse 69   Ht 5' 4 (1.626 m)   Wt 201 lb (91.2 kg)   SpO2 99%   BMI 34.50 kg/m  BP Readings from Last 3 Encounters:  10/22/24 122/72  10/13/24 134/84  08/18/24 (!) 102/56   Wt Readings from Last 3 Encounters:  10/22/24 201 lb (91.2 kg)  10/13/24 201 lb (91.2 kg)  08/18/24 198 lb (89.8 kg)      Physical Exam Constitutional:      Appearance: Normal appearance. She is obese.  HENT:     Head: Normocephalic.  Cardiovascular:     Rate and Rhythm: Normal rate and regular rhythm.  Pulmonary:     Effort: Pulmonary effort is normal.     Breath sounds: Normal breath sounds.  Musculoskeletal:     Right lower leg: No edema.     Left lower leg: No edema.  Neurological:     General: No focal deficit present.     Mental Status: She is alert and oriented to person, place, and time.  Psychiatric:        Mood and Affect: Mood normal.      The 10-year ASCVD risk score (Arnett DK, et al., 2019) is: 1.8%    Assessment & Plan:  .Terre was seen today for medical management of chronic issues.  Diagnoses and all orders for this visit:  No energy -     Cortisol, free, Serum -     TSH + free T4 -     CBC w/Diff/Platelet -     Fe+TIBC+Fer -     B12 and Folate Panel -     VITAMIN D 25 Hydroxy (Vit-D Deficiency, Fractures) -      Lipid panel -     CMP14+EGFR  Vitamin D deficiency -     VITAMIN D 25 Hydroxy (Vit-D Deficiency, Fractures)  B12 deficiency -     B12 and Folate Panel  Screening for diabetes mellitus -     CMP14+EGFR  Screening for lipid disorders -     Lipid panel  Non-restorative sleep   Will get labs to evaluate fatigue if normal then consider sleep study. Continue to work on weight loss with diet and exercise.  Consider magnesium at bedtime to help with sleep.     Ramin Zoll, PA-C  "

## 2024-10-27 ENCOUNTER — Ambulatory Visit: Payer: Self-pay | Admitting: Physician Assistant

## 2024-10-27 DIAGNOSIS — E559 Vitamin D deficiency, unspecified: Secondary | ICD-10-CM

## 2024-10-27 DIAGNOSIS — N1831 Chronic kidney disease, stage 3a: Secondary | ICD-10-CM | POA: Insufficient documentation

## 2024-10-27 DIAGNOSIS — E538 Deficiency of other specified B group vitamins: Secondary | ICD-10-CM

## 2024-10-27 LAB — CBC WITH DIFFERENTIAL/PLATELET
Basophils Absolute: 0.1 10*3/uL (ref 0.0–0.2)
Basos: 1 %
EOS (ABSOLUTE): 0.1 10*3/uL (ref 0.0–0.4)
Eos: 1 %
Hematocrit: 37.4 % (ref 34.0–46.6)
Hemoglobin: 12.3 g/dL (ref 11.1–15.9)
Immature Grans (Abs): 0 10*3/uL (ref 0.0–0.1)
Immature Granulocytes: 0 %
Lymphocytes Absolute: 2.8 10*3/uL (ref 0.7–3.1)
Lymphs: 37 %
MCH: 29.3 pg (ref 26.6–33.0)
MCHC: 32.9 g/dL (ref 31.5–35.7)
MCV: 89 fL (ref 79–97)
Monocytes Absolute: 0.6 10*3/uL (ref 0.1–0.9)
Monocytes: 8 %
Neutrophils Absolute: 4 10*3/uL (ref 1.4–7.0)
Neutrophils: 53 %
Platelets: 326 10*3/uL (ref 150–450)
RBC: 4.2 x10E6/uL (ref 3.77–5.28)
RDW: 13.6 % (ref 11.7–15.4)
WBC: 7.5 10*3/uL (ref 3.4–10.8)

## 2024-10-27 LAB — CMP14+EGFR
ALT: 18 [IU]/L (ref 0–32)
AST: 22 [IU]/L (ref 0–40)
Albumin: 4.3 g/dL (ref 3.8–4.9)
Alkaline Phosphatase: 42 [IU]/L — ABNORMAL LOW (ref 49–135)
BUN/Creatinine Ratio: 12 (ref 9–23)
BUN: 14 mg/dL (ref 6–24)
Bilirubin Total: 0.4 mg/dL (ref 0.0–1.2)
CO2: 21 mmol/L (ref 20–29)
Calcium: 9.3 mg/dL (ref 8.7–10.2)
Chloride: 104 mmol/L (ref 96–106)
Creatinine, Ser: 1.15 mg/dL — ABNORMAL HIGH (ref 0.57–1.00)
Globulin, Total: 2.9 g/dL (ref 1.5–4.5)
Glucose: 89 mg/dL (ref 70–99)
Potassium: 4.4 mmol/L (ref 3.5–5.2)
Sodium: 137 mmol/L (ref 134–144)
Total Protein: 7.2 g/dL (ref 6.0–8.5)
eGFR: 57 mL/min/{1.73_m2} — ABNORMAL LOW

## 2024-10-27 LAB — LIPID PANEL
Chol/HDL Ratio: 2.8 ratio (ref 0.0–4.4)
Cholesterol, Total: 199 mg/dL (ref 100–199)
HDL: 71 mg/dL
LDL Chol Calc (NIH): 116 mg/dL — ABNORMAL HIGH (ref 0–99)
Triglycerides: 66 mg/dL (ref 0–149)
VLDL Cholesterol Cal: 12 mg/dL (ref 5–40)

## 2024-10-27 LAB — B12 AND FOLATE PANEL
Folate: 11.1 ng/mL
Vitamin B-12: 309 pg/mL (ref 232–1245)

## 2024-10-27 LAB — TSH+FREE T4
Free T4: 1.09 ng/dL (ref 0.82–1.77)
TSH: 1.38 u[IU]/mL (ref 0.450–4.500)

## 2024-10-27 LAB — IRON,TIBC AND FERRITIN PANEL
Ferritin: 63 ng/mL (ref 15–150)
Iron Saturation: 27 % (ref 15–55)
Iron: 79 ug/dL (ref 27–159)
Total Iron Binding Capacity: 293 ug/dL (ref 250–450)
UIBC: 214 ug/dL (ref 131–425)

## 2024-10-27 LAB — VITAMIN D 25 HYDROXY (VIT D DEFICIENCY, FRACTURES): Vit D, 25-Hydroxy: 21.8 ng/mL — ABNORMAL LOW (ref 30.0–100.0)

## 2024-10-27 LAB — CORTISOL, FREE

## 2024-10-27 NOTE — Progress Notes (Signed)
 Aimee Sanchez,   Vitamin D is low. Start at least 1000 units or increase by if already taking daily. Make sure to take with dairy for better absorption. Recheck in 3 months.  B12 is low. Start b12 1000mcg daily. Recheck in 3 months.  Thyroid looks good.  Hemoglobin normal. Iron normal.  CKD 3 but stable. Consider starting farxiga  to help maintain kidney function. Recheck in 3 months.   Cholesterol looks good.   Aimee Sanchez.The 10-year ASCVD risk score (Arnett DK, et al., 2019) is: 1.7%   Values used to calculate the score:     Age: 54 years     Clinically relevant sex: Female     Is Non-Hispanic African American: Yes     Diabetic: No     Tobacco smoker: No     Systolic Blood Pressure: 122 mmHg     Is BP treated: No     HDL Cholesterol: 71 mg/dL     Total Cholesterol: 199 mg/dL   Chronic Kidney Disease in Adults: What to Know Chronic kidney disease (CKD) is when lasting damage happens to the kidneys slowly over time. The kidneys are two organs that do many important things in the body. These include: Taking waste and extra fluid out of the blood to make pee (urine). Making hormones. Keeping the right amount of fluids and chemicals in the body. A small amount of kidney damage may not cause problems. You must take steps to help keep the kidney damage from getting worse. A lot of damage may cause kidney failure. Kidney failure means the kidneys can no longer work right. What are the causes? Diabetes. High blood pressure. Diseases that affect the heart and blood vessels. Other kidney diseases. Diseases that affect the body's defense system (immune system). A problem with the flow of pee. This may be caused by: Kidney stones. Cancer. An enlarged prostate, in males. A kidney infection or urinary tract infection (UTI) that keeps coming back. What increases the risk? Getting older. The chances of having CKD increase with age. A family history of kidney disease or kidney failure. Having a disease  caused by genes. Taking medicines that can harm the kidneys. Being near or having contact with harmful substances. Being very overweight. Using tobacco now or in the past. What are the signs or symptoms? Common symptoms of CKD include: Feeling very tired and having less energy. Swelling of the face, legs, ankles, or feet. Throwing up or feeling like you may throw up. Not wanting to eat as much as normal. Being confused or not able to focus. Twitches and cramps in the leg muscles or other muscles. Dry, itchy skin. Other symptoms may include: Shortness of breath. Trouble sleeping. Making less pee, or making more pee, especially at night. A taste of metal in your mouth. You may also become anemic. Anemia means there's not enough red blood cells in your blood. You may get symptoms slowly. You may not notice them until the kidney damage gets very bad. How is this diagnosed? CKD may be diagnosed based on: Tests on your blood or pee. Imaging tests, like an ultrasound or a CT scan. A kidney biopsy. For this test, a sample of kidney tissue is removed to be looked at under a microscope. These tests will help to find out how serious the CKD is. How is this treated? Often, there's no cure for CKD. Treatment can help with symptoms and help keep the disease from getting worse. Treatment may include: Treating other problems that are causing  your CKD or making it worse. Diet changes. You may need to: Avoid alcohol. Avoid foods that are high in salt, potassium, phosphorous, and protein. Taking medicines for symptoms and to help control other conditions. Dialysis. This treatment gets harmful waste out of your body. It may be needed if you have kidney failure. Follow these instructions at home: Medicines Take your medicines only as told. The amount of some medicines you take may need to be changed. Do not take any new medicines, vitamins, or supplements unless your health care provider says it's  okay. These may make kidney damage worse. Lifestyle Do not smoke, vape, or use nicotine or tobacco. If you drink alcohol: Limit how much you have to: 0-1 drink a day if you're female. 0-2 drinks a day if you're female. Know how much alcohol is in your drink. In the [place] one drink is one 12 oz bottle of beer (355 mL), one 5 oz glass of wine (148 mL), or one 1 oz glass of hard liquor (44 mL). Stay at a healthy weight. If you need help, ask your provider. General instructions  Eat and drink as told. Track your blood pressure at home. Tell your provider about any changes. If you have diabetes, track your blood sugar as told. Exercise at least 30 minutes a day, 5 days a week. Keep your shots (vaccinations) up to date. Keep all follow-up visits. Your provider may need to change your treatments over time. Where to find support American Kidney Fund: [place].com.au Kidney School: kidneyschool.org American Association of Kidney Patients: https://www.miller-montoya.com/ Where to find more information National Kidney Foundation: kidney.org Centers for Disease Control and Prevention. To learn more: Go to Diningcalendar.de. Click Search. Type chronic kidney disease in the search box. Contact a health care provider if: You have new symptoms. You get symptoms of end-stage kidney disease. These include: Headaches. Numbness in your hands or feet. Leg cramps. Easy bruising. Get help right away if: You have a fever. You make less pee than usual. You have pain or bleeding when you pee or poop. You have chest pain. You have shortness of breath. These symptoms may be an emergency. Call 911 right away. Do not wait to see if the symptoms will go away. Do not drive yourself to the hospital. This information is not intended to replace advice given to you by your health care provider. Make sure you discuss any questions you have with your health care provider. Document Revised: 07/22/2023 Document Reviewed: 03/14/2023 Elsevier  Patient Education  2024 [employer].

## 2024-10-29 ENCOUNTER — Other Ambulatory Visit: Payer: Self-pay

## 2024-10-29 ENCOUNTER — Other Ambulatory Visit (HOSPITAL_COMMUNITY): Payer: Self-pay

## 2024-10-29 ENCOUNTER — Encounter (HOSPITAL_COMMUNITY): Payer: Self-pay

## 2024-10-29 MED ORDER — SCOPOLAMINE 1 MG/3DAYS TD PT72
1.0000 | MEDICATED_PATCH | TRANSDERMAL | 12 refills | Status: AC
  Filled 2024-10-29: qty 10, 30d supply, fill #0

## 2024-10-29 MED ORDER — DAPAGLIFLOZIN PROPANEDIOL 10 MG PO TABS
10.0000 mg | ORAL_TABLET | Freq: Every day | ORAL | 0 refills | Status: AC
  Filled 2024-10-29: qty 90, 90d supply, fill #0
